# Patient Record
Sex: Male | Born: 1980 | Race: Black or African American | Hispanic: No | Marital: Single | Smoking: Current every day smoker
Health system: Southern US, Community
[De-identification: ages and names within clinical notes are randomized; demographics above are authoritative.]

## PROBLEM LIST (undated history)

## (undated) DIAGNOSIS — N529 Male erectile dysfunction, unspecified: Secondary | ICD-10-CM

## (undated) DIAGNOSIS — W3400XA Accidental discharge from unspecified firearms or gun, initial encounter: Secondary | ICD-10-CM

## (undated) DIAGNOSIS — Y249XXA Unspecified firearm discharge, undetermined intent, initial encounter: Secondary | ICD-10-CM

## (undated) HISTORY — PX: LIVER BIOPSY: SHX301

---

## 1998-05-29 ENCOUNTER — Emergency Department (HOSPITAL_COMMUNITY): Admission: EM | Admit: 1998-05-29 | Discharge: 1998-05-29 | Payer: Self-pay | Admitting: Emergency Medicine

## 1998-10-01 ENCOUNTER — Emergency Department (HOSPITAL_COMMUNITY): Admission: EM | Admit: 1998-10-01 | Discharge: 1998-10-01 | Payer: Self-pay | Admitting: Emergency Medicine

## 2000-09-04 HISTORY — PX: OTHER SURGICAL HISTORY: SHX169

## 2002-04-10 ENCOUNTER — Encounter (INDEPENDENT_AMBULATORY_CARE_PROVIDER_SITE_OTHER): Payer: Self-pay | Admitting: *Deleted

## 2002-04-10 ENCOUNTER — Inpatient Hospital Stay (HOSPITAL_COMMUNITY): Admission: AC | Admit: 2002-04-10 | Discharge: 2002-04-28 | Payer: Self-pay

## 2002-04-10 ENCOUNTER — Encounter: Payer: Self-pay | Admitting: General Surgery

## 2002-04-12 ENCOUNTER — Encounter: Payer: Self-pay | Admitting: Surgery

## 2002-04-12 ENCOUNTER — Encounter: Payer: Self-pay | Admitting: General Surgery

## 2002-04-13 ENCOUNTER — Encounter: Payer: Self-pay | Admitting: Surgery

## 2002-04-14 ENCOUNTER — Encounter: Payer: Self-pay | Admitting: General Surgery

## 2002-04-15 ENCOUNTER — Encounter: Payer: Self-pay | Admitting: General Surgery

## 2002-04-16 ENCOUNTER — Encounter: Payer: Self-pay | Admitting: General Surgery

## 2002-04-17 ENCOUNTER — Encounter: Payer: Self-pay | Admitting: General Surgery

## 2002-04-18 ENCOUNTER — Encounter: Payer: Self-pay | Admitting: General Surgery

## 2002-04-19 ENCOUNTER — Encounter: Payer: Self-pay | Admitting: General Surgery

## 2002-04-20 ENCOUNTER — Encounter: Payer: Self-pay | Admitting: Cardiothoracic Surgery

## 2002-04-20 ENCOUNTER — Encounter: Payer: Self-pay | Admitting: General Surgery

## 2002-04-21 ENCOUNTER — Encounter: Payer: Self-pay | Admitting: General Surgery

## 2002-04-23 ENCOUNTER — Encounter: Payer: Self-pay | Admitting: Cardiothoracic Surgery

## 2002-04-24 ENCOUNTER — Encounter: Payer: Self-pay | Admitting: Cardiothoracic Surgery

## 2002-04-25 ENCOUNTER — Encounter: Payer: Self-pay | Admitting: Cardiothoracic Surgery

## 2002-04-26 ENCOUNTER — Encounter: Payer: Self-pay | Admitting: Cardiothoracic Surgery

## 2002-04-27 ENCOUNTER — Encounter: Payer: Self-pay | Admitting: Thoracic Surgery (Cardiothoracic Vascular Surgery)

## 2002-04-27 ENCOUNTER — Encounter: Payer: Self-pay | Admitting: Cardiothoracic Surgery

## 2002-04-28 ENCOUNTER — Encounter: Payer: Self-pay | Admitting: Cardiothoracic Surgery

## 2006-01-03 ENCOUNTER — Encounter: Payer: Self-pay | Admitting: Emergency Medicine

## 2006-12-21 ENCOUNTER — Emergency Department (HOSPITAL_COMMUNITY): Admission: EM | Admit: 2006-12-21 | Discharge: 2006-12-21 | Payer: Self-pay | Admitting: Emergency Medicine

## 2007-03-28 ENCOUNTER — Emergency Department (HOSPITAL_COMMUNITY): Admission: EM | Admit: 2007-03-28 | Discharge: 2007-03-28 | Payer: Self-pay | Admitting: Emergency Medicine

## 2010-11-13 ENCOUNTER — Emergency Department (HOSPITAL_COMMUNITY)
Admission: EM | Admit: 2010-11-13 | Discharge: 2010-11-13 | Disposition: A | Discharge status: Home / Self Care | Payer: Self-pay | Attending: Emergency Medicine | Admitting: Emergency Medicine

## 2010-11-13 DIAGNOSIS — N489 Disorder of penis, unspecified: Secondary | ICD-10-CM | POA: Insufficient documentation

## 2010-11-13 DIAGNOSIS — N4830 Priapism, unspecified: Secondary | ICD-10-CM | POA: Insufficient documentation

## 2010-12-05 ENCOUNTER — Emergency Department (HOSPITAL_COMMUNITY)
Admission: EM | Admit: 2010-12-05 | Discharge: 2010-12-05 | Disposition: A | Discharge status: Home / Self Care | Payer: Self-pay | Attending: Emergency Medicine | Admitting: Emergency Medicine

## 2010-12-05 DIAGNOSIS — N4830 Priapism, unspecified: Secondary | ICD-10-CM | POA: Insufficient documentation

## 2010-12-17 ENCOUNTER — Emergency Department (HOSPITAL_COMMUNITY)
Admission: EM | Admit: 2010-12-17 | Discharge: 2010-12-17 | Disposition: A | Discharge status: Home / Self Care | Payer: Self-pay | Attending: Emergency Medicine | Admitting: Emergency Medicine

## 2010-12-17 DIAGNOSIS — N4830 Priapism, unspecified: Secondary | ICD-10-CM | POA: Insufficient documentation

## 2010-12-17 DIAGNOSIS — Z9889 Other specified postprocedural states: Secondary | ICD-10-CM | POA: Insufficient documentation

## 2010-12-17 DIAGNOSIS — Z87828 Personal history of other (healed) physical injury and trauma: Secondary | ICD-10-CM | POA: Insufficient documentation

## 2010-12-22 NOTE — Consult Note (Signed)
[name], [name] NO.:  [id number]  MEDICAL RECORD NO.:  [id number]           PATIENT TYPE:  E  LOCATION:  WLED                         FACILITY:  [facility]  PHYSICIAN:  Heloise Purpura, MD      DATE OF BIRTH:  1980/09/07  DATE OF CONSULTATION:  12/05/2010 DATE OF DISCHARGE:  12/05/2010                                CONSULTATION   REQUESTING PHYSICIAN:  [name] Jensen, M.D.  REASON FOR CONSULTATION:  Priapism.  HISTORY:  [name] Jensen is a 30 year old gentleman who presents to the emergency room after developing an erection which would not go down. This began at 11 a.m. this morning and presented this evening for further evaluation.  He describes moderate pain in the penis.  He denies having sickle cell disease or having a family history of sickle cell anemia or sickle cell trait.  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  He was injected with phenylephrine by Dr. Effie Jensen in theemergency department with subsequent resolution.  He presents today after also having been drinking alcohol and doing ecstasy in the past 24 hours.  He denies any fever, nausea, vomiting, or other symptoms.  PAST MEDICAL HISTORY:  None.  PAST SURGICAL HISTORY:  No GU surgery.  MEDICATIONS:  None.  ALLERGIES:  No known drug allergies.  FAMILY HISTORY:  No GU malignancy.  No history of sickle cell disease or sickle cell trait.  SOCIAL HISTORY:  He does use illicit drugs as stated above.  REVIEW OF SYSTEMS:  A complete review of systems was reviewed. Pertinent positives are included as in the history of present illness.  PHYSICAL EXAMINATION:  VITAL SIGNS:  He is afebrile with stable vital signs.  Blood pressure is 130/90. ABDOMEN:  Soft, nondistended, nontender. GU:  He has a firm, rigid erection with a rigid corpora cavernosa, but a flaccid glans penis and corpus spongiosum.  The penis is cold to  touch. He does have mild-to-moderate pain on palpation.  PROCEDURE:  The patient had been injected with 400 mcg of phenylephrine by Dr. Effie Jensen prior to my arrival to the emergency department without response over the past 90 minutes.  I had a discussion with the patient and counseled him about the risks of permanent penile damage based on the duration of his priapism as well as the potential risk for future erectile dysfunction based on the duration of his priapism up to this point.  He expresses understanding of this long-term risk simply based on the time that the priapism had been occurring.  After discussing the risks and potential complications as well as alternative options, I placed to 21-gauge butterfly needle into the left corpora cavernosa near the base of the penis.  I then irrigated the corpora with saline and aspirated blood.  Blood was easily aspirated.  I then injected him with 500 mcg of phenylephrine and 1 cc of saline.  His blood pressure was monitored and remained stable.  He had some slight improvement and required 3 more injections at the same dose which was repeated approximately every  5-8 minutes.  He also underwent further irrigation with saline and aspiration of blood from the penis.  After these injections of phenylephrine and aspiration/irrigation, his penis became semi-flaccid.  Although not totally flaccid, he no longer had a rigid erection and was felt that he would be stable for discharge home after monitoring him for 30 minutes and maintaining his semi-flaccid penis.  IMPRESSION:  Priapism.  PLAN:  [name] Jensen was strongly encouraged not to do illicit drugs and specifically ecstasy based on his episodes of recurrent priapism.  I also counseled him that he may have permanent damage to the penis and possible long-standing erectile dysfunction related to his priapism, especially considering the duration of his priapism upon presentation to the emergency  department this evening.  He expressed his understanding. He was felt to be stable for discharge home.     Heloise Purpura, MD     LB/MEDQ  D:  12/05/2010  T:  12/06/2010  Job:  [id number]  cc:   [name] Jensen, M.D. Fax: [phone]  Electronically Signed by Heloise Purpura MD on 12/22/2010 06:01:34 AM

## 2011-01-03 ENCOUNTER — Emergency Department (HOSPITAL_COMMUNITY)
Admission: EM | Admit: 2011-01-03 | Discharge: 2011-01-03 | Disposition: A | Discharge status: Home / Self Care | Payer: Self-pay | Attending: Emergency Medicine | Admitting: Emergency Medicine

## 2011-01-03 DIAGNOSIS — N4830 Priapism, unspecified: Secondary | ICD-10-CM | POA: Insufficient documentation

## 2011-01-03 DIAGNOSIS — N509 Disorder of male genital organs, unspecified: Secondary | ICD-10-CM | POA: Insufficient documentation

## 2011-01-11 NOTE — Consult Note (Signed)
NAMEARMONTE, TORTORELLA NO.:  [id number]  MEDICAL RECORD NO.:  [id number]           PATIENT TYPE:  E  LOCATION:  MCED                         FACILITY:  [facility]  PHYSICIAN:  Valetta Fuller, M.D.  DATE OF BIRTH:  15-Mar-1981  DATE OF CONSULTATION:  01/03/2011 DATE OF DISCHARGE:  01/03/2011                                CONSULTATION   REASON FOR CONSULTATION:  Priapism.  HISTORY OF PRESENT ILLNESS:  This is a 30 year old gentleman with no significant past medical history who presents to [facility] Emergency Room this morning after awakening with an erection at approximately 9 a.m.  He was unable to detumesce at his home.  He denies any pharmacological medication use such as Viagra, etc.  He denies any cocaine use, or Ecstasy use.  He does state he did use marijuana within the past 24 hours.  He denies any alcohol use.  He states that these erections have occurred 2 times prior to today within the last 1-1/2 month.  Both times he had presented to the emergency department for treatment.  He states it has occurred 2 further time before that in his early 40s or late teens without treatment required.  He has minimal complaint of discomfort at this time.  He denies any complaints of nausea, vomiting, fever, chills, diarrhea.  He denies any complaints of difficulty with urination, dysuria, urinary frequency, urinary urgency, hematuria, or nocturia.  He denies any known family history of sickle cell trait or sickle cell disease. He is unaware of his screening for sickle cell trait.  PAST MEDICAL HISTORY:  There is no known significant past medical history.  ALLERGIES:  He has no known drug allergies.  MEDICATIONS:  He takes no medications regularly.  FAMILY HISTORY:  He denies any family history of kidney cancer, bladder cancer, or prostate cancer.  SOCIAL HISTORY:  He lives here in [place].  He does state that he uses illicit drug use  consisting of marijuana and Ecstasy.  He denies any tobacco use.  He uses alcohol occasionally.  REVIEW OF SYSTEMS:  As stated above.  PHYSICAL EXAMINATION:  VITAL SIGNS:  Temperature 97.8, pulse 73, respirations 16, blood pressure 148/99. CONSTITUTIONAL:  He is a well-developed, well-nourished white male, in no acute distress. HEENT:  Normocephalic, atraumatic.  Oropharynx is clear. ABDOMEN:  Soft, nontender, nondistended. GU:  Penis with rigid, erect corpora cavernosa without lesion or mass. Bilaterally descended testes. EXTREMITIES:  Nontender, no atrophy. NEUROLOGIC:  Remote and recent memory are intact.  IMPRESSION/PLAN:  Priapism, will require injection of phenylephrine/saline mixture with probable saline irrigation to detumesce penis.  He should follow up after discharge from hospital for further evaluation consisting of screening for sickle cell trait, sickle cell disease.  He has been counseled on no further illicit drug use.  PROCEDURE:  Penis was prepped and draped in sterile fashion.  His corpora was injected 2 mL of 1% plain lidocaine.  A 21-gauge butterfly needle was placed on the right lateral side of the corpora cavernosa mid shaft.  The corpora was irrigated with copious amounts of normal saline. He was  then injected with a total of approximately 600 mcg of the phenylephrine saline solution a few at a time over 10-15 minutes.   Again, the corpora was copiously irrigated with large amount of dark blood removed.  Approximately 20 minutes later, it was 70% detumesced.  A pressure dressing was placed at the site of the butterfly needle.  He was again observed for another 15 minutes.  The corpora remained 70-90% detumescence at that time.  He again was told of he is need to follow up for further evaluation including screening for sickle cell trait.  He was also again counseled on no further illicit drug use.     Delia Chimes,  NP   ______________________________ Valetta Fuller, M.D.    MA/MEDQ  D:  01/03/2011  T:  01/04/2011  Job:  [id number]  Electronically Signed by Delia Chimes NP on 01/04/2011 01:22:56 PM Electronically Signed by Barron Alvine M.D. on 01/11/2011 08:53:03 AM

## 2011-01-12 ENCOUNTER — Emergency Department (HOSPITAL_COMMUNITY)
Admission: EM | Admit: 2011-01-12 | Discharge: 2011-01-12 | Disposition: A | Discharge status: Home / Self Care | Payer: Self-pay | Attending: Emergency Medicine | Admitting: Emergency Medicine

## 2011-01-12 DIAGNOSIS — N4830 Priapism, unspecified: Secondary | ICD-10-CM | POA: Insufficient documentation

## 2011-01-12 DIAGNOSIS — Z9889 Other specified postprocedural states: Secondary | ICD-10-CM | POA: Insufficient documentation

## 2011-01-12 DIAGNOSIS — N489 Disorder of penis, unspecified: Secondary | ICD-10-CM | POA: Insufficient documentation

## 2011-01-14 ENCOUNTER — Emergency Department (HOSPITAL_COMMUNITY)
Admission: EM | Admit: 2011-01-14 | Discharge: 2011-01-14 | Disposition: A | Discharge status: Home / Self Care | Payer: Self-pay | Attending: Emergency Medicine | Admitting: Emergency Medicine

## 2011-01-14 DIAGNOSIS — N4830 Priapism, unspecified: Secondary | ICD-10-CM | POA: Insufficient documentation

## 2011-01-18 ENCOUNTER — Emergency Department (HOSPITAL_COMMUNITY)
Admission: EM | Admit: 2011-01-18 | Discharge: 2011-01-18 | Disposition: A | Discharge status: Home / Self Care | Payer: Self-pay | Attending: Emergency Medicine | Admitting: Emergency Medicine

## 2011-01-18 DIAGNOSIS — N4830 Priapism, unspecified: Secondary | ICD-10-CM | POA: Insufficient documentation

## 2011-01-18 DIAGNOSIS — I10 Essential (primary) hypertension: Secondary | ICD-10-CM | POA: Insufficient documentation

## 2011-01-18 LAB — URINALYSIS, ROUTINE W REFLEX MICROSCOPIC
Bilirubin Urine: NEGATIVE
Glucose, UA: NEGATIVE mg/dL
Hgb urine dipstick: NEGATIVE
Ketones, ur: NEGATIVE mg/dL
Nitrite: NEGATIVE
Protein, ur: NEGATIVE mg/dL
Specific Gravity, Urine: 1.026 (ref 1.005–1.030)
Urobilinogen, UA: 1 mg/dL (ref 0.0–1.0)
pH: 6.5 (ref 5.0–8.0)

## 2011-01-18 LAB — RAPID URINE DRUG SCREEN, HOSP PERFORMED
Amphetamines: NOT DETECTED
Benzodiazepines: NOT DETECTED
Cocaine: NOT DETECTED
Opiates: NOT DETECTED
Tetrahydrocannabinol: POSITIVE — AB

## 2011-01-18 LAB — DIFFERENTIAL
Basophils Relative: 1 % (ref 0–1)
Eosinophils Absolute: 0.2 10*3/uL (ref 0.0–0.7)
Eosinophils Relative: 2 % (ref 0–5)
Lymphs Abs: 1.6 10*3/uL (ref 0.7–4.0)
Neutrophils Relative %: 72 % (ref 43–77)

## 2011-01-18 LAB — BASIC METABOLIC PANEL
BUN: 7 mg/dL (ref 6–23)
Chloride: 105 mEq/L (ref 96–112)
Creatinine, Ser: 0.82 mg/dL (ref 0.4–1.5)
GFR calc Af Amer: >60 mL/min (ref >60)
GFR calc non Af Amer: >60 mL/min (ref >60)
Potassium: 3.7 mEq/L (ref 3.5–5.1)

## 2011-01-18 LAB — CBC
MCV: 88.8 fL (ref 78.0–100.0)
Platelets: 291 10*3/uL (ref 150–400)
RBC: 4.82 MIL/uL (ref 4.22–5.81)
RDW: 13.4 % (ref 11.5–15.5)
WBC: 9.5 10*3/uL (ref 4.0–10.5)
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
## 2011-01-25 ENCOUNTER — Emergency Department (HOSPITAL_COMMUNITY)
Admission: EM | Admit: 2011-01-25 | Discharge: 2011-01-25 | Disposition: A | Discharge status: Home / Self Care | Payer: Self-pay | Attending: Emergency Medicine | Admitting: Emergency Medicine

## 2011-01-25 DIAGNOSIS — N4830 Priapism, unspecified: Secondary | ICD-10-CM | POA: Insufficient documentation

## 2011-01-27 ENCOUNTER — Emergency Department (HOSPITAL_COMMUNITY)
Admission: EM | Admit: 2011-01-27 | Discharge: 2011-01-27 | Disposition: A | Discharge status: Other Acute Inpatient Hospital | Payer: Self-pay | Attending: Emergency Medicine | Admitting: Emergency Medicine

## 2011-01-27 ENCOUNTER — Emergency Department (HOSPITAL_COMMUNITY)
Admission: EM | Admit: 2011-01-27 | Discharge: 2011-01-27 | Disposition: A | Discharge status: Home / Self Care | Payer: Self-pay | Attending: Emergency Medicine | Admitting: Emergency Medicine

## 2011-01-27 DIAGNOSIS — N4830 Priapism, unspecified: Secondary | ICD-10-CM | POA: Insufficient documentation

## 2011-02-04 ENCOUNTER — Emergency Department (HOSPITAL_COMMUNITY)
Admission: EM | Admit: 2011-02-04 | Discharge: 2011-02-04 | Disposition: A | Discharge status: Home / Self Care | Payer: Self-pay | Attending: Emergency Medicine | Admitting: Emergency Medicine

## 2011-02-04 DIAGNOSIS — N4830 Priapism, unspecified: Secondary | ICD-10-CM | POA: Insufficient documentation

## 2011-02-16 ENCOUNTER — Emergency Department (HOSPITAL_COMMUNITY)
Admission: EM | Admit: 2011-02-16 | Discharge: 2011-02-16 | Disposition: A | Discharge status: Home / Self Care | Payer: Self-pay | Attending: Emergency Medicine | Admitting: Emergency Medicine

## 2011-02-16 DIAGNOSIS — N4830 Priapism, unspecified: Secondary | ICD-10-CM | POA: Insufficient documentation

## 2011-02-21 ENCOUNTER — Emergency Department (HOSPITAL_COMMUNITY)
Admission: EM | Admit: 2011-02-21 | Discharge: 2011-02-21 | Disposition: A | Discharge status: Home / Self Care | Payer: Self-pay | Attending: Emergency Medicine | Admitting: Emergency Medicine

## 2011-02-21 DIAGNOSIS — N4830 Priapism, unspecified: Secondary | ICD-10-CM | POA: Insufficient documentation

## 2011-02-25 ENCOUNTER — Emergency Department (HOSPITAL_COMMUNITY)
Admission: EM | Admit: 2011-02-25 | Discharge: 2011-02-25 | Disposition: A | Discharge status: Home / Self Care | Payer: Self-pay | Attending: Emergency Medicine | Admitting: Emergency Medicine

## 2011-02-25 DIAGNOSIS — Z23 Encounter for immunization: Secondary | ICD-10-CM | POA: Insufficient documentation

## 2011-02-25 DIAGNOSIS — S1190XA Unspecified open wound of unspecified part of neck, initial encounter: Secondary | ICD-10-CM | POA: Insufficient documentation

## 2011-02-25 DIAGNOSIS — X58XXXA Exposure to other specified factors, initial encounter: Secondary | ICD-10-CM | POA: Insufficient documentation

## 2011-02-25 DIAGNOSIS — S21109A Unspecified open wound of unspecified front wall of thorax without penetration into thoracic cavity, initial encounter: Secondary | ICD-10-CM | POA: Insufficient documentation

## 2011-05-11 ENCOUNTER — Emergency Department (HOSPITAL_COMMUNITY)
Admission: EM | Admit: 2011-05-11 | Discharge: 2011-05-11 | Disposition: A | Discharge status: Home / Self Care | Payer: Self-pay | Attending: Emergency Medicine | Admitting: Emergency Medicine

## 2011-05-11 DIAGNOSIS — N4830 Priapism, unspecified: Secondary | ICD-10-CM | POA: Insufficient documentation

## 2011-05-11 LAB — URINALYSIS, ROUTINE W REFLEX MICROSCOPIC
Leukocytes, UA: NEGATIVE
Nitrite: NEGATIVE
Protein, ur: NEGATIVE mg/dL
pH: 7 (ref 5.0–8.0)

## 2011-05-21 ENCOUNTER — Emergency Department (HOSPITAL_COMMUNITY)
Admission: EM | Admit: 2011-05-21 | Discharge: 2011-05-22 | Disposition: A | Discharge status: Home / Self Care | Payer: No Typology Code available for payment source | Attending: Emergency Medicine | Admitting: Emergency Medicine

## 2011-05-21 DIAGNOSIS — Y92410 Unspecified street and highway as the place of occurrence of the external cause: Secondary | ICD-10-CM | POA: Insufficient documentation

## 2011-05-21 DIAGNOSIS — T1490XA Injury, unspecified, initial encounter: Secondary | ICD-10-CM | POA: Insufficient documentation

## 2011-07-11 ENCOUNTER — Emergency Department (HOSPITAL_COMMUNITY)
Admission: EM | Admit: 2011-07-11 | Discharge: 2011-07-11 | Discharge status: Against Medical Advice | Payer: Self-pay | Attending: Emergency Medicine | Admitting: Emergency Medicine

## 2011-07-11 ENCOUNTER — Encounter: Payer: Self-pay | Admitting: Emergency Medicine

## 2011-07-11 DIAGNOSIS — W19XXXA Unspecified fall, initial encounter: Secondary | ICD-10-CM | POA: Insufficient documentation

## 2011-07-11 DIAGNOSIS — S298XXA Other specified injuries of thorax, initial encounter: Secondary | ICD-10-CM | POA: Insufficient documentation
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
## 2012-02-16 ENCOUNTER — Emergency Department (HOSPITAL_COMMUNITY)
Admission: EM | Admit: 2012-02-16 | Discharge: 2012-02-16 | Disposition: A | Discharge status: Home / Self Care | Payer: Self-pay | Attending: Emergency Medicine | Admitting: Emergency Medicine

## 2012-02-16 ENCOUNTER — Emergency Department (HOSPITAL_COMMUNITY): Payer: Self-pay

## 2012-02-16 ENCOUNTER — Encounter (HOSPITAL_COMMUNITY): Payer: Self-pay

## 2012-02-16 DIAGNOSIS — IMO0002 Reserved for concepts with insufficient information to code with codable children: Secondary | ICD-10-CM | POA: Insufficient documentation

## 2012-02-16 DIAGNOSIS — S62609A Fracture of unspecified phalanx of unspecified finger, initial encounter for closed fracture: Secondary | ICD-10-CM

## 2012-02-16 DIAGNOSIS — W219XXA Striking against or struck by unspecified sports equipment, initial encounter: Secondary | ICD-10-CM | POA: Insufficient documentation

## 2012-02-16 HISTORY — DX: Accidental discharge from unspecified firearms or gun, initial encounter: W34.00XA

## 2012-02-16 HISTORY — DX: Unspecified firearm discharge, undetermined intent, initial encounter: Y24.9XXA

## 2012-02-16 MED ORDER — IBUPROFEN 800 MG PO TABS: 800.0000 mg | ORAL_TABLET | Freq: Three times a day (TID) | ORAL | Status: AC

## 2012-02-16 MED ORDER — IBUPROFEN 800 MG PO TABS
800.0000 mg | ORAL_TABLET | Freq: Once | ORAL | Status: AC
  Administered 2012-02-16: 800 mg via ORAL
  Filled 2012-02-16: qty 1

## 2012-02-16 NOTE — ED Notes (Signed)
Patient states that he was punching a punching bag and hurt his left ring finger

## 2012-02-16 NOTE — ED Provider Notes (Signed)
Medical screening examination/treatment/procedure(s) were performed by non-physician practitioner and as supervising physician I was immediately available for consultation/collaboration.   Tashena Ibach A Roylee Chaffin, MD 02/16/12 2357 
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
## 2012-02-16 NOTE — Discharge Instructions (Signed)
Be sure to read and understand instructions below prior to leaving the hospital. Follow up with the orthopedic listed above for your injury.   Buddy Taping  You have a minor finger or toe injury. It can be managed by buddy taping. Buddy taping means the injured finger or toe is taped to a healthy uninjured adjacent finger or toe. Most minor fractures and dislocations of the smaller fingers and toes will heal in 3 to 4 weeks. Buddy taping immobilizes and protects the area of injury. Buddy taping is not recommended for initial treatment of fractures of the thumb, longer fingers, or the great toe. Buddy taping should not be used for unstable or deformed fractures, but as fracture healing progresses it may be used for protection during rehabilitation. Fractured fingers and toes should be protected by buddy taping as long as the injury is still painful or swollen.  When an injury is buddy taped, place a small piece of gauze or cotton between the digits that are taped. This helps prevent the skin from breaking down from increased moisture. Buddy taping allows you to get your injury wet when you bathe. Change the gauze and tape more often if it gets wet, and dry the space between the finger or toes. Use a sturdy, hard-soled shoe for better support if you have a fractured toe. In 2 to 3 weeks you can start motion exercises. This will keep the fingers or toes from becoming stiff.  SEEK IMMEDIATE MEDICAL CARE IF:  The injured area becomes cold, numb, or pale.  You have pain not controlled with medications.  You notice increasing deformity of the toe or finger.   TREATMENT  Rest, ice, elevation, and compression are the basic modes of treatment.    HOME CARE INSTRUCTIONS  Apply ice to the sore area for 15 to 20 minutes, 3 to 4 times per day. Do this while you are awake for the first 2 days, or as directed. This can be stopped when the swelling goes away. Put the ice in a plastic bag and place a towel between the  bag of ice and your skin.  Keep your arm elevated above your heart when possible to lessen swelling.  ACTIVITY- Exercises should be limited to pain free range of motion  SEEK IMMEDIATE MEDICAL CARE IF:  Your fingers are swollen, very red, white, or cold and blue.  Your fingers are numb or tingling.  You have increasing pain.  You have difficulty moving your fingers.   HOW TO MAKE AN ICE PACK  To make an ice pack, do one of the following:  Place crushed ice or a bag of frozen vegetables in a sealable plastic bag. Squeeze out the excess air. Place this bag inside another plastic bag. Slide the bag into a pillowcase or place a damp towel between your skin and the bag.  Mix 3 parts water with 1 part rubbing alcohol. Freeze the mixture in a sealable plastic bag. When you remove the mixture from the freezer, it will be slushy. Squeeze out the excess air. Place this bag inside another plastic bag. Slide the bag into a pillowcase or place a damp towel between your skin  RESOURCE GUIDE  Chronic Pain Problems: Contact [facility]  [phone] Patients need to be referred by their primary care doctor.  Insufficient Money for Medicine: Contact United Way:  call "211" or [facility] 215-830-9571.  No Primary Care Doctor: - Call [facility] Connect  [phone] - can help  you locate a primary care doctor that  accepts your insurance, provides certain services, etc. - Physician Referral Service- [phone]  Agencies that provide inexpensive medical care: - [facility]  [phone] - [facility]  [phone] - [facility]  [phone] - Women's Clinic  [phone] - Planned Parenthood  [phone] [facility]  [phone]  Medicaid-accepting [facility] Providers: - [facility]- [address]  [phone], Mon-Fri 9am-7pm, Sat 9am-1pm - [facility]- [address] [place], Suite  [place]  [phone] - [facility]- [address], [address] [place]  [phone] [facility]- [address]  [phone] - Renaye Rakers- [address] [place], [address], [phone]  Only accepts [place] patients after they have their name  applied to their card  Self Pay (no insurance) in [place]: - Sickle Cell Patients: Dr Willey Blade, [facility]  [address] [place], [phone] - [facility]- [address] [place]  [phone]       [facility] [place]- [address]       -     [facility]- see information above (Speak to [employer] if you do not have insurance)       -  [facility]- [address] [place], [phone]       -  [facility]- [address],  [phone]       -  [facility]- [address], [phone]       -  Dr Julio Sicks-  [address], [place], [phone]       -  [facility]- [address] [phone]       -  [facility]- [address], [phone], also [address], [phone]       -    [facility] [place] [facility]- [address] [place], [phone], 1st & 3rd Saturday   every month, 10am-1pm  1) Find a Doctor and Pay Out of Pocket Although you won't have to find out who is covered by your insurance plan, it is a good idea to ask around and get recommendations. You will then need to call the office and see if the doctor you have chosen will accept you as a new patient and what types of options they offer for patients who are self-pay. Some doctors offer discounts or will set up payment plans for their patients who do not have insurance, but you will need to ask so you aren't surprised when you get to your appointment.  2) Contact Your Local Health Department Not all health departments have doctors that can see patients for sick visits, but many do, so it is worth a call to see if yours does.  If you don't know where your local health department is, you can check in your phone book. The CDC also has a tool to help you locate your state's health department, and many state websites also have listings of all of their local health departments.  3) Find a Walk-in Clinic If your illness is not likely to be very severe or complicated, you may want to try a walk in clinic. These are popping up all over the country in pharmacies, drugstores, and shopping centers. They're usually staffed by  nurse practitioners or physician assistants that have been trained to treat common illnesses and complaints. They're usually fairly quick and inexpensive. However, if you have serious medical issues or chronic medical problems, these are probably not your best option  STD Testing - [facility] Department of [facility], STD Clinic, [address] [place], phone [phone] or [phone].  Monday - Friday, call for an appointment. [facility] Department of [employer], STD Clinic, [address], [place], phone [phone] or [phone].  Monday - Friday, call for an appointment.  Abuse/Neglect: [facility] Child Abuse Hotline [phone] [facility] Child Abuse Hotline (561) 523-4817 (After Hours)  Emergency Shelter:  [facility] (984)517-5893  Maternity Homes: - [facility] 416-501-2208 - [facility] [phone]  MRSA Hotline #:   (818)013-3060  [facility] [place] Resources  [facility] [place]  United Way [facility] Dept. [address]                 [address]         [address] [place] [address]  Blondell Reveal Phone:  [phone]                                  Phone:  [phone]                   Phone:  [phone]  [facility], [phone] - [phone]       -     [facility] in [place], [address]                                  [phone], [facility] Child Abuse Hotline 806-794-2490 or [phone] (After Hours)   Behavioral Health Services  Substance Abuse Resources: - [facility] Drug [facility]  571-722-1444 - [facility] [phone] - [facility] [place] [phone] Floydene Flock [phone] - Residential & Outpatient Substance Abuse Program  2626203256  Psychological Services: [facility]  [phone] Services  [phone] - [facility], [phone] [place]. [address], [place], ACCESS LINE: 831-335-1660 or [phone], [employer]  Dental Assistance  If unable to pay or uninsured, contact:  [facility] or [facility] to become qualified for the adult dental clinic.  Patients with Medicaid: [facility] [phone] [address], [phone] [address] [phone]  If unable to pay, or uninsured, contact [facility] [phone]) or [facility] Department [phone] in [place], [phone] in [facility]) to become qualified for the adult dental clinic  Other Low-Cost Community Dental Services: - [facility]- [address] [place], [place], [place], [address], [phone], [address]. [address], 2nd and 4th Thursday of the month  at 6:30am.  10 clients each day by appointment, can sometimes see walk-in patients if someone does not show for an appointment. [facility]- [address] [place], [place], [address], [phone] - [facility]- [address], [place], [place], [address], [phone] - [facility]- [phone] [facility] Health Department- [phone] [facility] Department- [phone]

## 2013-07-30 ENCOUNTER — Encounter (HOSPITAL_COMMUNITY): Payer: Self-pay | Admitting: Emergency Medicine

## 2013-07-30 ENCOUNTER — Emergency Department (HOSPITAL_COMMUNITY)
Admission: EM | Admit: 2013-07-30 | Discharge: 2013-07-30 | Disposition: A | Discharge status: Home / Self Care | Payer: Self-pay | Attending: Emergency Medicine | Admitting: Emergency Medicine

## 2013-07-30 ENCOUNTER — Emergency Department (HOSPITAL_COMMUNITY): Payer: Self-pay

## 2013-07-30 DIAGNOSIS — F17200 Nicotine dependence, unspecified, uncomplicated: Secondary | ICD-10-CM | POA: Insufficient documentation

## 2013-07-30 DIAGNOSIS — R1084 Generalized abdominal pain: Secondary | ICD-10-CM | POA: Insufficient documentation

## 2013-07-30 DIAGNOSIS — R109 Unspecified abdominal pain: Secondary | ICD-10-CM

## 2013-07-30 DIAGNOSIS — Z87828 Personal history of other (healed) physical injury and trauma: Secondary | ICD-10-CM | POA: Insufficient documentation

## 2013-07-30 DIAGNOSIS — R197 Diarrhea, unspecified: Secondary | ICD-10-CM | POA: Insufficient documentation

## 2013-07-30 DIAGNOSIS — R112 Nausea with vomiting, unspecified: Secondary | ICD-10-CM | POA: Insufficient documentation

## 2013-07-30 LAB — CBC WITH DIFFERENTIAL/PLATELET
Basophils Absolute: 0.1 10*3/uL (ref 0.0–0.1)
Basophils Relative: 1 % (ref 0–1)
Lymphocytes Relative: 33 % (ref 12–46)
MCHC: 34.6 g/dL (ref 30.0–36.0)
Neutro Abs: 3.4 10*3/uL (ref 1.7–7.7)
Neutrophils Relative %: 52 % (ref 43–77)
RDW: 14.4 % (ref 11.5–15.5)
WBC: 6.6 10*3/uL (ref 4.0–10.5)

## 2013-07-30 LAB — URINALYSIS, ROUTINE W REFLEX MICROSCOPIC
Glucose, UA: NEGATIVE mg/dL
Hgb urine dipstick: NEGATIVE
Ketones, ur: NEGATIVE mg/dL
Leukocytes, UA: NEGATIVE
pH: 7 (ref 5.0–8.0)

## 2013-07-30 LAB — COMPREHENSIVE METABOLIC PANEL
ALT: 20 U/L (ref 0–53)
AST: 21 U/L (ref 0–37)
Albumin: 4.2 g/dL (ref 3.5–5.2)
Alkaline Phosphatase: 65 U/L (ref 39–117)
Chloride: 104 mEq/L (ref 96–112)
Creatinine, Ser: 0.94 mg/dL (ref 0.50–1.35)
Potassium: 3.6 mEq/L (ref 3.5–5.1)
Sodium: 140 mEq/L (ref 135–145)
Total Bilirubin: 0.8 mg/dL (ref 0.3–1.2)

## 2013-07-30 MED ORDER — OXYCODONE HCL 5 MG PO TABS
5.0000 mg | ORAL_TABLET | Freq: Once | ORAL | Status: AC
  Administered 2013-07-30: 5 mg via ORAL
  Filled 2013-07-30: qty 1

## 2013-07-30 MED ORDER — PROMETHAZINE HCL 25 MG PO TABS: 25.0000 mg | ORAL_TABLET | Freq: Four times a day (QID) | ORAL | Status: DC | PRN
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
## 2013-07-30 NOTE — ED Notes (Signed)
Pt given 2 [place] sandwiches, eating and drinking at discharge. Denies pain at present.

## 2013-07-30 NOTE — ED Notes (Signed)
Pt c/o generalized abd pain with some nausea x 2 days

## 2013-10-24 ENCOUNTER — Emergency Department (HOSPITAL_COMMUNITY)
Admission: EM | Admit: 2013-10-24 | Discharge: 2013-10-24 | Disposition: A | Discharge status: Home / Self Care | Payer: Self-pay | Attending: Emergency Medicine | Admitting: Emergency Medicine

## 2013-10-24 ENCOUNTER — Encounter (HOSPITAL_COMMUNITY): Payer: Self-pay | Admitting: Emergency Medicine

## 2013-10-24 DIAGNOSIS — Z87828 Personal history of other (healed) physical injury and trauma: Secondary | ICD-10-CM | POA: Insufficient documentation

## 2013-10-24 DIAGNOSIS — F17200 Nicotine dependence, unspecified, uncomplicated: Secondary | ICD-10-CM | POA: Insufficient documentation

## 2013-10-24 DIAGNOSIS — J111 Influenza due to unidentified influenza virus with other respiratory manifestations: Secondary | ICD-10-CM | POA: Insufficient documentation

## 2013-10-24 LAB — CBC WITH DIFFERENTIAL/PLATELET
BASOS ABS: 0.1 10*3/uL (ref 0.0–0.1)
BASOS PCT: 1 % (ref 0–1)
EOS ABS: 0.5 10*3/uL (ref 0.0–0.7)
EOS PCT: 6 % — AB (ref 0–5)
HEMATOCRIT: 44 % (ref 39.0–52.0)
Hemoglobin: 15 g/dL (ref 13.0–17.0)
LYMPHS PCT: 21 % (ref 12–46)
Lymphs Abs: 1.9 10*3/uL (ref 0.7–4.0)
MCH: 30.5 pg (ref 26.0–34.0)
MCHC: 34.1 g/dL (ref 30.0–36.0)
MCV: 89.4 fL (ref 78.0–100.0)
MONO ABS: 0.8 10*3/uL (ref 0.1–1.0)
Monocytes Relative: 9 % (ref 3–12)
Neutro Abs: 5.9 10*3/uL (ref 1.7–7.7)
Neutrophils Relative %: 64 % (ref 43–77)
PLATELETS: 378 10*3/uL (ref 150–400)
RBC: 4.92 MIL/uL (ref 4.22–5.81)
RDW: 12.8 % (ref 11.5–15.5)
WBC: 9.2 10*3/uL (ref 4.0–10.5)

## 2013-10-24 LAB — COMPREHENSIVE METABOLIC PANEL
ALBUMIN: 4 g/dL (ref 3.5–5.2)
ALT: 17 U/L (ref 0–53)
AST: 19 U/L (ref 0–37)
Alkaline Phosphatase: 66 U/L (ref 39–117)
BUN: 9 mg/dL (ref 6–23)
CALCIUM: 9.4 mg/dL (ref 8.4–10.5)
CO2: 25 mEq/L (ref 19–32)
CREATININE: 0.88 mg/dL (ref 0.50–1.35)
Chloride: 102 mEq/L (ref 96–112)
GFR calc Af Amer: >90 mL/min (ref >90)
Glucose, Bld: 103 mg/dL — ABNORMAL HIGH (ref 70–99)
Potassium: 4.1 mEq/L (ref 3.7–5.3)
SODIUM: 140 meq/L (ref 137–147)
TOTAL PROTEIN: 7.5 g/dL (ref 6.0–8.3)
Total Bilirubin: 0.6 mg/dL (ref 0.3–1.2)

## 2013-10-24 LAB — URINALYSIS, ROUTINE W REFLEX MICROSCOPIC
Bilirubin Urine: NEGATIVE
GLUCOSE, UA: NEGATIVE mg/dL
HGB URINE DIPSTICK: NEGATIVE
KETONES UR: NEGATIVE mg/dL
Leukocytes, UA: NEGATIVE
Nitrite: NEGATIVE
PROTEIN: NEGATIVE mg/dL
Specific Gravity, Urine: 1.022 (ref 1.005–1.030)
UROBILINOGEN UA: 4 mg/dL — AB (ref 0.0–1.0)
pH: 6.5 (ref 5.0–8.0)

## 2013-10-24 NOTE — ED Notes (Signed)
The pt is c/o havfing a cold with a headache cough sorethroat abd pain nose running diarrhea.  Feeling  Very cold all for 3-4 days

## 2013-10-24 NOTE — Discharge Instructions (Signed)
Antibiotic Nonuse ° Your caregiver felt that the infection or problem was not one that would be helped with an antibiotic. °Infections may be caused by viruses or bacteria. Only a caregiver can tell which one of these is the likely cause of an illness. A cold is the most common cause of infection in both adults and children. A cold is a virus. Antibiotic treatment will have no effect on a viral infection. Viruses can lead to many lost days of work caring for sick children and many missed days of school. Children may catch as many as 10 "colds" or "flus" per year during which they can be tearful, cranky, and uncomfortable. The goal of treating a virus is aimed at keeping the ill person comfortable. °Antibiotics are medications used to help the body fight bacterial infections. There are relatively few types of bacteria that cause infections but there are hundreds of viruses. While both viruses and bacteria cause infection they are very different types of germs. A viral infection will typically go away by itself within 7 to 10 days. Bacterial infections may spread or get worse without antibiotic treatment. °Examples of bacterial infections are: °· Sore throats (like strep throat or tonsillitis). °· Infection in the lung (pneumonia). °· Ear and skin infections. °Examples of viral infections are: °· Colds or flus. °· Most coughs and bronchitis. °· Sore throats not caused by Strep. °· Runny noses. °It is often best not to take an antibiotic when a viral infection is the cause of the problem. Antibiotics can kill off the helpful bacteria that we have inside our body and allow harmful bacteria to start growing. Antibiotics can cause side effects such as allergies, nausea, and diarrhea without helping to improve the symptoms of the viral infection. Additionally, repeated uses of antibiotics can cause bacteria inside of our body to become resistant. That resistance can be passed onto harmful bacterial. The next time you have  an infection it may be harder to treat if antibiotics are used when they are not needed. Not treating with antibiotics allows our own immune system to develop and take care of infections more efficiently. Also, antibiotics will work better for us when they are prescribed for bacterial infections. °Treatments for a child that is ill may include: °· Give extra fluids throughout the day to stay hydrated. °· Get plenty of rest. °· Only give your child over-the-counter or prescription medicines for pain, discomfort, or fever as directed by your caregiver. °· The use of a cool mist humidifier may help stuffy noses. °· Cold medications if suggested by your caregiver. °Your caregiver may decide to start you on an antibiotic if: °· The problem you were seen for today continues for a longer length of time than expected. °· You develop a secondary bacterial infection. °SEEK MEDICAL CARE IF: °· Fever lasts longer than 5 days. °· Symptoms continue to get worse after 5 to 7 days or become severe. °· Difficulty in breathing develops. °· Signs of dehydration develop (poor drinking, rare urinating, dark colored urine). °· Changes in behavior or worsening tiredness (listlessness or lethargy). °Document Released: 10/30/2001 Document Revised: 11/13/2011 Document Reviewed: 04/28/2009 °ExitCare® Patient Information ©2014 [employer]. °You appear to have an upper respiratory infection (URI). An upper respiratory tract infection, or cold, is a viral infection of the air passages leading to the lungs. It is contagious and can be spread to others, especially during the first 3 or 4 days. It cannot be cured by antibiotics or other medicines. °RETURN IMMEDIATELY   IF you develop shortness of breath, confusion or altered mental status, a new rash, become dizzy, faint, or poorly responsive, or are unable to be cared for at home. °
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
## 2013-12-15 ENCOUNTER — Encounter (HOSPITAL_COMMUNITY): Payer: Self-pay | Admitting: Emergency Medicine

## 2013-12-15 ENCOUNTER — Emergency Department (HOSPITAL_COMMUNITY)
Admission: EM | Admit: 2013-12-15 | Discharge: 2013-12-16 | Disposition: A | Discharge status: Home / Self Care | Payer: Self-pay | Attending: Emergency Medicine | Admitting: Emergency Medicine

## 2013-12-15 ENCOUNTER — Emergency Department (HOSPITAL_COMMUNITY): Payer: Self-pay

## 2013-12-15 DIAGNOSIS — R630 Anorexia: Secondary | ICD-10-CM | POA: Insufficient documentation

## 2013-12-15 DIAGNOSIS — Z87828 Personal history of other (healed) physical injury and trauma: Secondary | ICD-10-CM | POA: Insufficient documentation

## 2013-12-15 DIAGNOSIS — R42 Dizziness and giddiness: Secondary | ICD-10-CM | POA: Insufficient documentation

## 2013-12-15 DIAGNOSIS — R109 Unspecified abdominal pain: Secondary | ICD-10-CM | POA: Insufficient documentation

## 2013-12-15 DIAGNOSIS — R05 Cough: Secondary | ICD-10-CM

## 2013-12-15 DIAGNOSIS — R059 Cough, unspecified: Secondary | ICD-10-CM

## 2013-12-15 DIAGNOSIS — J159 Unspecified bacterial pneumonia: Secondary | ICD-10-CM | POA: Insufficient documentation

## 2013-12-15 DIAGNOSIS — F17200 Nicotine dependence, unspecified, uncomplicated: Secondary | ICD-10-CM | POA: Insufficient documentation

## 2013-12-15 DIAGNOSIS — R51 Headache: Secondary | ICD-10-CM | POA: Insufficient documentation

## 2013-12-15 DIAGNOSIS — J189 Pneumonia, unspecified organism: Secondary | ICD-10-CM

## 2013-12-15 MED ORDER — LEVOFLOXACIN 500 MG PO TABS
750.0000 mg | ORAL_TABLET | Freq: Once | ORAL | Status: AC
  Administered 2013-12-16: 750 mg via ORAL
  Filled 2013-12-15: qty 2

## 2013-12-15 NOTE — ED Notes (Signed)
Patient states that he is having a cough and congestion for the last 2 -3 days
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
## 2013-12-16 MED ORDER — ALBUTEROL SULFATE HFA 108 (90 BASE) MCG/ACT IN AERS
2.0000 | INHALATION_SPRAY | Freq: Once | RESPIRATORY_TRACT | Status: AC
  Administered 2013-12-16: 2 via RESPIRATORY_TRACT
  Filled 2013-12-16: qty 6.7

## 2013-12-16 MED ORDER — HYDROCODONE-HOMATROPINE 5-1.5 MG/5ML PO SYRP: 5.0000 mL | ORAL_SOLUTION | Freq: Four times a day (QID) | ORAL | Status: DC | PRN

## 2013-12-16 MED ORDER — LEVOFLOXACIN 250 MG PO TABS: 750.0000 mg | ORAL_TABLET | Freq: Every day | ORAL | Status: DC

## 2013-12-16 NOTE — Discharge Instructions (Signed)
Take Levaquin to cover for pneumonia. Use an albuterol inhaler, 2 puffs every 4 hours as needed for shortness of breath. Take Tylenol for fever and chest discomfort. Take Hycodan for cough. Get plenty of rest and drink plenty of fluids. Return if symptoms worsen.  Pneumonia, Adult Pneumonia is an infection of the lungs.  CAUSES Pneumonia may be caused by bacteria or a virus. Usually, these infections are caused by breathing infectious particles into the lungs (respiratory tract). SYMPTOMS   Cough.  Fever.  Chest pain.  Increased rate of breathing.  Wheezing.  Mucus production. DIAGNOSIS  If you have the common symptoms of pneumonia, your caregiver will typically confirm the diagnosis with a chest X-ray. The X-ray will show an abnormality in the lung (pulmonary infiltrate) if you have pneumonia. Other tests of your blood, urine, or sputum may be done to find the specific cause of your pneumonia. Your caregiver may also do tests (blood gases or pulse oximetry) to see how well your lungs are working. TREATMENT  Some forms of pneumonia may be spread to other people when you cough or sneeze. You may be asked to wear a mask before and during your exam. Pneumonia that is caused by bacteria is treated with antibiotic medicine. Pneumonia that is caused by the influenza virus may be treated with an antiviral medicine. Most other viral infections must run their course. These infections will not respond to antibiotics.  PREVENTION A pneumococcal shot (vaccine) is available to prevent a common bacterial cause of pneumonia. This is usually suggested for:  People over 35 years old.  Patients on chemotherapy.  People with chronic lung problems, such as bronchitis or emphysema.  People with immune system problems. If you are over 65 or have a high risk condition, you may receive the pneumococcal vaccine if you have not received it before. In some countries, a routine influenza vaccine is also  recommended. This vaccine can help prevent some cases of pneumonia.You may be offered the influenza vaccine as part of your care. If you smoke, it is time to quit. You may receive instructions on how to stop smoking. Your caregiver can provide medicines and counseling to help you quit. HOME CARE INSTRUCTIONS   Cough suppressants may be used if you are losing too much rest. However, coughing protects you by clearing your lungs. You should avoid using cough suppressants if you can.  Your caregiver may have prescribed medicine if he or she thinks your pneumonia is caused by a bacteria or influenza. Finish your medicine even if you start to feel better.  Your caregiver may also prescribe an expectorant. This loosens the mucus to be coughed up.  Only take over-the-counter or prescription medicines for pain, discomfort, or fever as directed by your caregiver.  Do not smoke. Smoking is a common cause of bronchitis and can contribute to pneumonia. If you are a smoker and continue to smoke, your cough may last several weeks after your pneumonia has cleared.  A cold steam vaporizer or humidifier in your room or home may help loosen mucus.  Coughing is often worse at night. Sleeping in a semi-upright position in a recliner or using a couple pillows under your head will help with this.  Get rest as you feel it is needed. Your body will usually let you know when you need to rest. SEEK IMMEDIATE MEDICAL CARE IF:   Your illness becomes worse. This is especially true if you are elderly or weakened from any other disease.  You  cannot control your cough with suppressants and are losing sleep.  You begin coughing up blood.  You develop pain which is getting worse or is uncontrolled with medicines.  You have a fever.  Any of the symptoms which initially brought you in for treatment are getting worse rather than better.  You develop shortness of breath or chest pain. MAKE SURE YOU:   Understand these  instructions.  Will watch your condition.  Will get help right away if you are not doing well or get worse. Document Released: 08/21/2005 Document Revised: 11/13/2011 Document Reviewed: 11/10/2010 [facility] Patient Information 2014 [place].

## 2013-12-22 NOTE — ED Provider Notes (Signed)
Medical screening examination/treatment/procedure(s) were performed by non-physician practitioner and as supervising physician I was immediately available for consultation/collaboration.   EKG Interpretation None        Shon Batonourtney F Horton, MD 12/22/13 1711

## 2014-02-08 ENCOUNTER — Emergency Department (HOSPITAL_COMMUNITY)
Admission: EM | Admit: 2014-02-08 | Discharge: 2014-02-09 | Disposition: A | Discharge status: Home / Self Care | Payer: Self-pay | Attending: Emergency Medicine | Admitting: Emergency Medicine

## 2014-02-08 ENCOUNTER — Encounter (HOSPITAL_COMMUNITY): Payer: Self-pay | Admitting: Emergency Medicine

## 2014-02-08 DIAGNOSIS — L03317 Cellulitis of buttock: Principal | ICD-10-CM

## 2014-02-08 DIAGNOSIS — F17200 Nicotine dependence, unspecified, uncomplicated: Secondary | ICD-10-CM | POA: Insufficient documentation

## 2014-02-08 DIAGNOSIS — L0231 Cutaneous abscess of buttock: Secondary | ICD-10-CM | POA: Insufficient documentation

## 2014-02-08 DIAGNOSIS — Z79899 Other long term (current) drug therapy: Secondary | ICD-10-CM | POA: Insufficient documentation

## 2014-02-08 DIAGNOSIS — Z87828 Personal history of other (healed) physical injury and trauma: Secondary | ICD-10-CM | POA: Insufficient documentation

## 2014-02-08 NOTE — ED Notes (Signed)
Per pt report: pt has a recurrent abscess on pt's left butt cheek. Pt poped the abscess but pt reports it keeps coming back. Pt has concerns for infection.

## 2014-02-09 MED ORDER — SULFAMETHOXAZOLE-TRIMETHOPRIM 800-160 MG PO TABS: 1.0000 | ORAL_TABLET | Freq: Two times a day (BID) | ORAL | Status: DC

## 2014-02-09 MED ORDER — CEPHALEXIN 500 MG PO CAPS: 500.0000 mg | ORAL_CAPSULE | Freq: Four times a day (QID) | ORAL | Status: DC

## 2014-02-09 MED ORDER — SULFAMETHOXAZOLE-TRIMETHOPRIM 800-160 MG PO TABS: 1.0000 | ORAL_TABLET | Freq: Two times a day (BID) | ORAL | Status: AC

## 2014-02-09 NOTE — Discharge Instructions (Signed)
Take Bactrim and Keflex as prescribed. Follow up with general surgery to discuss removal of the core of your cyst.  Abscess Care After An abscess (also called a boil or furuncle) is an infected area that contains a collection of pus. Signs and symptoms of an abscess include pain, tenderness, redness, or hardness, or you may feel a moveable soft area under your skin. An abscess can occur anywhere in the body. The infection may spread to surrounding tissues causing cellulitis. A cut (incision) by the surgeon was made over your abscess and the pus was drained out. Gauze may have been packed into the space to provide a drain that will allow the cavity to heal from the inside outwards. The boil may be painful for 5 to 7 days. Most people with a boil do not have high fevers. Your abscess, if seen early, may not have localized, and may not have been lanced. If not, another appointment may be required for this if it does not get better on its own or with medications. HOME CARE INSTRUCTIONS   Only take over-the-counter or prescription medicines for pain, discomfort, or fever as directed by your caregiver.  When you bathe, soak and then remove gauze or iodoform packs at least daily or as directed by your caregiver. You may then wash the wound gently with mild soapy water. Repack with gauze or do as your caregiver directs. SEEK IMMEDIATE MEDICAL CARE IF:   You develop increased pain, swelling, redness, drainage, or bleeding in the wound site.  You develop signs of generalized infection including muscle aches, chills, fever, or a general ill feeling.  An oral temperature above 102 F (38.9 C) develops, not controlled by medication. See your caregiver for a recheck if you develop any of the symptoms described above. If medications (antibiotics) were prescribed, take them as directed. Document Released: 03/09/2005 Document Revised: 11/13/2011 Document Reviewed: 11/04/2007 [facility] Patient Information 2014  [place], [place].
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
## 2014-02-15 NOTE — ED Provider Notes (Signed)
Medical screening examination/treatment/procedure(s) were performed by non-physician practitioner and as supervising physician I was immediately available for consultation/collaboration.   EKG Interpretation None        Brandt LoosenJulie Jaylah Goodlow, MD 02/15/14 [phone]

## 2014-03-15 ENCOUNTER — Emergency Department (HOSPITAL_COMMUNITY)
Admission: EM | Admit: 2014-03-15 | Discharge: 2014-03-15 | Disposition: A | Discharge status: Home / Self Care | Payer: Self-pay | Attending: Emergency Medicine | Admitting: Emergency Medicine

## 2014-03-15 ENCOUNTER — Encounter (HOSPITAL_COMMUNITY): Payer: Self-pay | Admitting: Emergency Medicine

## 2014-03-15 DIAGNOSIS — R300 Dysuria: Secondary | ICD-10-CM

## 2014-03-15 DIAGNOSIS — F17200 Nicotine dependence, unspecified, uncomplicated: Secondary | ICD-10-CM | POA: Insufficient documentation

## 2014-03-15 DIAGNOSIS — Z87828 Personal history of other (healed) physical injury and trauma: Secondary | ICD-10-CM | POA: Insufficient documentation

## 2014-03-15 DIAGNOSIS — R369 Urethral discharge, unspecified: Secondary | ICD-10-CM | POA: Insufficient documentation

## 2014-03-15 LAB — URINALYSIS, ROUTINE W REFLEX MICROSCOPIC
Bilirubin Urine: NEGATIVE
Glucose, UA: NEGATIVE mg/dL
HGB URINE DIPSTICK: NEGATIVE
KETONES UR: 15 mg/dL — AB
Nitrite: NEGATIVE
PROTEIN: NEGATIVE mg/dL
Specific Gravity, Urine: 1.024 (ref 1.005–1.030)
Urobilinogen, UA: 1 mg/dL (ref 0.0–1.0)
pH: 6 (ref 5.0–8.0)

## 2014-03-15 LAB — URINE MICROSCOPIC-ADD ON

## 2014-03-15 MED ORDER — AZITHROMYCIN 250 MG PO TABS
2000.0000 mg | ORAL_TABLET | Freq: Once | ORAL | Status: AC
  Administered 2014-03-15: 2000 mg via ORAL
  Filled 2014-03-15: qty 8

## 2014-03-15 MED ORDER — LIDOCAINE HCL (PF) 1 % IJ SOLN
0.9000 mL | Freq: Once | INTRAMUSCULAR | Status: AC
  Administered 2014-03-15: 0.9 mL
  Filled 2014-03-15: qty 5

## 2014-03-15 MED ORDER — CEFTRIAXONE SODIUM 250 MG IJ SOLR
250.0000 mg | Freq: Once | INTRAMUSCULAR | Status: AC
  Administered 2014-03-15: 250 mg via INTRAMUSCULAR
  Filled 2014-03-15: qty 250
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
## 2014-03-15 NOTE — ED Notes (Signed)
Informed pt that he has to stay for 30 minutes to ensure that he does not have a reaction to the injection. Stated that he wont have a reaction. Explained that this is our policy and this nurse will bring in his dc papers when they are ready

## 2014-03-15 NOTE — ED Notes (Signed)
The pt is c/o painful urination since Thursday.  He thinks he has  A std
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
## 2014-03-15 NOTE — Discharge Instructions (Signed)
Sexually Transmitted Disease °A sexually transmitted disease (STD) is a disease or infection that may be passed (transmitted) from person to person, usually during sexual activity. This may happen by way of saliva, semen, blood, vaginal mucus, or urine. Common STDs include:  °· Gonorrhea.   °· Chlamydia.   °· Syphilis.   °· HIV and AIDS.   °· Genital herpes.   °· Hepatitis B and C.   °· Trichomonas.   °· Human papillomavirus (HPV).   °· Pubic lice.   °· Scabies. °· Mites. °· Bacterial vaginosis. °WHAT ARE CAUSES OF STDs? °An STD may be caused by bacteria, a virus, or parasites. STDs are often transmitted during sexual activity if one person is infected. However, they may also be transmitted through nonsexual means. STDs may be transmitted after:  °· Sexual intercourse with an infected person.   °· Sharing sex toys with an infected person.   °· Sharing needles with an infected person or using unclean piercing or tattoo needles. °· Having intimate contact with the genitals, mouth, or rectal areas of an infected person.   °· Exposure to infected fluids during birth. °WHAT ARE THE SIGNS AND SYMPTOMS OF STDs? °Different STDs have different symptoms. Some people may not have any symptoms. If symptoms are present, they may include:  °· Painful or bloody urination.   °· Pain in the pelvis, abdomen, vagina, anus, throat, or eyes.   °· A skin rash, itching, or irritation. °· Growths, ulcerations, blisters, or sores in the genital and anal areas. °· Abnormal vaginal discharge with or without bad odor.   °· Penile discharge in men.   °· Fever.   °· Pain or bleeding during sexual intercourse.   °· Swollen glands in the groin area.   °· Yellow skin and eyes (jaundice). This is seen with hepatitis.   °· Swollen testicles. °· Infertility. °· Sores and blisters in the mouth. °HOW ARE STDs DIAGNOSED? °To make a diagnosis, your health care provider may:  °· Take a medical history.   °· Perform a physical exam.   °· Take a sample of  any discharge to examine. °· Swab the throat, cervix, opening to the penis, rectum, or vagina for testing. °· Test a sample of your first morning urine.   °· Perform blood tests.   °· Perform a Pap test, if this applies.   °· Perform a colposcopy.   °· Perform a laparoscopy.   °HOW ARE STDs TREATED? ° Treatment depends on the STD. Some STDs may be treated but not cured.  °· Chlamydia, gonorrhea, trichomonas, and syphilis can be cured with antibiotic medicine.   °· Genital herpes, hepatitis, and HIV can be treated, but not cured, with prescribed medicines. The medicines lessen symptoms.   °· Genital warts from HPV can be treated with medicine or by freezing, burning (electrocautery), or surgery. Warts may come back.   °· HPV cannot be cured with medicine or surgery. However, abnormal areas may be removed from the cervix, vagina, or vulva.   °· If your diagnosis is confirmed, your recent sexual partners need treatment. This is true even if they are symptom-free or have a negative culture or evaluation. They should not have sex until their health care providers say it is okay. °HOW CAN I REDUCE MY RISK OF GETTING AN STD? °Take these steps to reduce your risk of getting an STD: °· Use latex condoms, dental dams, and water-soluble lubricants during sexual activity. Do not use petroleum jelly or oils. °· Avoid having multiple sex partners. °· Do not have sex with someone who has other sex partners. °· Do not have sex with anyone you do not know or who is at   high risk for an STD. °· Avoid risky sex practices that can break your skin. °· Do not have sex if you have open sores on your mouth or skin. °· Avoid drinking too much alcohol or taking illegal drugs. Alcohol and drugs can affect your judgment and put you in a vulnerable position. °· Avoid engaging in oral and anal sex acts. °· Get vaccinated for HPV and hepatitis. If you have not received these vaccines in the past, talk to your health care provider about whether one  or both might be right for you.   °· If you are at risk of being infected with HIV, it is recommended that you take a prescription medicine daily to prevent HIV infection. This is called pre-exposure prophylaxis (PrEP). You are considered at risk if: °¨ You are a man who has sex with other men (MSM). °¨ You are a heterosexual man or woman and are sexually active with more than one partner. °¨ You take drugs by injection. °¨ You are sexually active with a partner who has HIV. °· Talk with your health care provider about whether you are at high risk of being infected with HIV. If you choose to begin PrEP, you should first be tested for HIV. You should then be tested every 3 months for as long as you are taking PrEP.   °WHAT SHOULD I DO IF I THINK I HAVE AN STD? °· See your health care provider.   °· Tell your sexual partner(s). They should be tested and treated for any STDs. °· Do not have sex until your health care provider says it is okay.  °WHEN SHOULD I GET IMMEDIATE MEDICAL CARE? °Contact your health care provider right away if:  °· You have severe abdominal pain. °· You are a man and notice swelling or pain in your testicles. °· You are a woman and notice swelling or pain in your vagina. °Document Released: 11/11/2002 Document Revised: 08/26/2013 Document Reviewed: 03/11/2013 °ExitCare® Patient Information ©2015 [employer]. This information is not intended to replace advice given to you by your health care provider. Make sure you discuss any questions you have with your health care provider. ° ° °Emergency Department Resource Guide °1) Find a Doctor and Pay Out of Pocket °Although you won't have to find out who is covered by your insurance plan, it is a good idea to ask around and get recommendations. You will then need to call the office and see if the doctor you have chosen will accept you as a new patient and what types of options they offer for patients who are self-pay. Some doctors offer discounts or  will set up payment plans for their patients who do not have insurance, but you will need to ask so you aren't surprised when you get to your appointment. ° °2) Contact Your Local Health Department °Not all health departments have doctors that can see patients for sick visits, but many do, so it is worth a call to see if yours does. If you don't know where your local health department is, you can check in your phone book. The CDC also has a tool to help you locate your state's health department, and many state websites also have listings of all of their local health departments. ° °3) Find a Walk-in Clinic °If your illness is not likely to be very severe or complicated, you may want to try a walk in clinic. These are popping up all over the country in pharmacies, drugstores, and shopping centers. They're usually   staffed by nurse practitioners or physician assistants that have been trained to treat common illnesses and complaints. They're usually fairly quick and inexpensive. However, if you have serious medical issues or chronic medical problems, these are probably not your best option. ° °No Primary Care Doctor: °- Call [facility] at  832-8000 - they can help you locate a primary care doctor that  accepts your insurance, provides certain services, etc. °- Physician Referral Service- 1-800-533-3463 ° °Chronic Pain Problems: °Organization         Address  Phone   Notes  °[facility]  (336) 297-2271 Patients need to be referred by their primary care doctor.  ° °Medication Assistance: °Organization         Address  Phone   Notes  °[place] [facility] [address] °[place], [place] [address] (336) 641-8030 --Must be a resident of [place] °-- Must have NO insurance coverage whatsoever (no Medicaid/ Medicare, etc.) °-- The pt. MUST have a primary care doctor that directs their care regularly and follows them in the community °  °[facility]  (866)  331-1348   °[employer]  (888) 892-1162   ° °Agencies that provide inexpensive medical care: °Organization         Address  Phone   Notes  °[facility]  (336) 832-8035   °[facility]    (336) 832-7272   °[facility] [address] °[place], [place] [address] (336) 832-4777   °[facility] [address], °[place] (336) 271-4999   °[facility]    (336) 373-0678   °[facility]    (336) 272-1050   °[facility] ° [address], [place] Phone:  (336) 832-4444, Fax:  (336) 832-4440 Hours of Operation:  9 am - 6 pm, M-F.  Also accepts Medicaid/Medicare and self-pay.  °[facility] ° [address], [place] Phone: (336) 832-3150, Fax: (336) 832-3151. Hours of Operation:  8:30 am - 5:30 pm, M-F.  Also accepts Medicaid and self-pay.  °[facility] [place] [address], [place] Phone: (336) 878-6027   °[facility] [address], [place], [place] (336)723-1848, Ext. 123 Mondays & Thursdays: 7-9 AM.  First 15 patients are seen on a first come, first serve basis. °  ° °Medicaid-accepting [place] Providers: ° °Organization         Address  Phone   Notes  °[facility] [address], [place] (336) 641-2100 Also accepts self-pay patients.  °[facility] [address], [place] ° (336) 856-9996   °[facility] [address], [place] (336) 288-8857   °[facility] [address] [place] [address], [place] (336) 299-7000   °Veita Bland [address], [place]  ° (336) 373-1557 Only accepts [place] Access Medicaid patients after they have their name applied to their card.  ° °Self-Pay (no insurance) in [place]: ° °Organization         Address  Phone   Notes  °Sickle Cell Patients, [facility] [address],  (336) 832-1970   °[facility] [address],  (336) 832-4400   °[facility] ° [address],  (336) 992-4800   °[facility]/Dr. Osei-Bonsu ° [address] [place] [address], [place] or [address], [place] (336) 841-8500 Phone number for both [place] and [place] locations is the same.  °[facility] [address], [place] (336) 299-0000   °[facility] [address] [place] [address], [place] or [address] (336) 852-7530 °(336) 878-2260   °[facility] [address], [place] (336) 350-1642, phone; (336) 294-5005, fax Sees patients 1st and 3rd Saturday of every month.  Must not qualify for public or private insurance (i.e. Medicaid, Medicare, [place] Health Choice, Veterans' Benefits) • Household income should be no more than 200% of the poverty level •The clinic cannot treat you if you are pregnant or think you are pregnant • Sexually transmitted diseases are not treated at the clinic.  ° ° °Dental Care: °Organization         Address  Phone  Notes  °[facility] [address], [place] (336) 641-6152 Accepts children up to age 21 who are enrolled in Medicaid or [place] Health Choice; pregnant women with a Medicaid card; and children who have applied for Medicaid or [place] Health Choice, but were declined, whose parents can pay a reduced fee at time of service.  °[facility] [place]  [address], [place] (336) 641-7733 Accepts children up to age 21 who are enrolled in Medicaid or [place] Health Choice; pregnant women with a Medicaid card; and children who have applied for Medicaid or [place] Health Choice, but were declined, whose parents can pay a reduced fee at time of service.  °[facility] ° [address], [place] (336)  641-4533 Patients are seen by appointment only. Walk-ins are not accepted. [facility] will see patients 18 years of age and older. °Monday - Tuesday (8am-5pm) °Most Wednesdays (8:30-5pm) °$30 per visit, cash only  °[facility] ° [address], [place] (336) 641-4533 Patients are seen by appointment only. Walk-ins are not accepted. [facility] will see patients 18 years of age and older. °One Wednesday Evening (Monthly: Volunteer Based).  $30 per visit, cash only  °[facility]  (919) 537-3737 for adults; Children under age 4, call Graduate Pediatric Dentistry at (919) [phone]. Children aged 4-14, please call (919) [phone] to request a pediatric application. ° Dental services are provided in all areas of dental care including fillings, crowns and bridges, complete and partial dentures, implants, gum treatment, root canals, and extractions. Preventive care is also provided. Treatment is provided to both adults and children. °Patients are selected via a lottery and there is often a waiting list. °  °[facility] [address], °[place] ° (336) 763-8833 [email or web address] °  °[facility] [address], [place], [place] (336)723-1848, Ext. 123 Second and Fourth Thursday of each month, opens at 6:30 AM; Clinic ends at 9 AM.  Patients are seen on a first-come first-served basis, and a limited number are seen during each clinic.  ° °[facility] ° [address], [place], [place] (336) 723-7904   Eligibility Requirements °You must have lived in [place], [place], or [place] counties for at least the last three months. °  You cannot be eligible for state or federal sponsored healthcare insurance, including Veterans Administration, Medicaid, or Medicare. °  You generally cannot be eligible for healthcare insurance through your employer.  °    How to apply: °Eligibility screenings are held every Tuesday and Wednesday afternoon  from 1:00 pm until 4:00 pm. You do not need an appointment for the interview!  °[facility] [address], [place], [place] 336-631-2330   °[facility]  336-342-8273   °[facility]  336-703-3100   °[facility]  336-570-6415   ° °Behavioral Health Resources in the Community: °Intensive Outpatient Programs °Organization         Address  Phone  Notes  °[facility] [address], [place], [place] 336-878-6098   °[facility] [address], [place], [place] 336-832-9800   °[facility] [address], [place], [place] ° 336-882-2125   °[facility] [address],  °[place], [place] 1-800-853-5163 or 336-641-4981   °Substance Abuse Resources °Organization         Address  Phone  Notes  °[facility]  336-882-2125   °[facility]  336-784-9470   °[facility]  336-285-9073   °[facility]  336-845-3988   °Residential & Outpatient Substance Abuse Program  1-800-659-3381   °Psychological Services °Organization         Address  Phone  Notes  °[facility]  336- 832-9600   °[facility]  336- 378-7881   °[facility] [address], [place] 1-800-853-5163 or 336-641-4981   ° °Mobile Crisis Teams °Organization         Address  Phone  Notes  °[facility], Mobile Crisis Care Unit  1-877-626-1772   °Assertive °[facility] ° [address] [place], [place] 336-834-9664   °Sharon DeEsch [address] °[place] 336-554-5454   ° °Self-Help/Support Groups °Organization         Address  Phone             Notes  °[facility] - variety of support groups  336- 373-1402 Call for more information  °Narcotics Anonymous (NA), [facility] [address], °[place]  2 meetings at this location  ° °Residential Treatment  Programs °Organization         Address  Phone  Notes  °[facility] [address],    °[place]  1-866-801-8205   °[facility] ° [address], [place], [place] 704-293-8524   °[facility] [address], [place] 336-845-3988 Admissions: 8am-3pm M-F  °[facility] [address]    °[place], [place] 336-841-1104   °[facility] [address], [place], [place] 336-379-7146   °[facility] [address]  °[place], [place] 336-285-9073   °[facility] Intensive Outpatient [address], [place], [place] 336-852-3033   °[facility] [address]  °[place], [place] 1-877-615-2722 or 336-784-9470   °[facility] [address] [place], [place] 336-227-7417 Accepts Medicaid  °[facility] [address]  °[place] 1-800-659-3381 Substance Abuse/Addiction Treatment  ° °[place] Behavioral Health Resources °Organization         Address  Phone  Notes  °[facility]  (888) 581-9988   °Julie Brannon, PhD [address] [place], [place]   (336) 349-5553 or (336) 951-0000   °[facility]   [address] °, [place] (336) 349-4454   °[facility] [address], [place], [place] (336) 342-8316 Insurance/Medicaid/sponsorship through [facility]  °[facility] [address]                                    [place], [place] (336) 342-8316 Therapy/tele-psych/case  °[facility] [address]  ° [place], [place] (336) 349-2233    °Dr. Arfeen  (336) 349-4544   °[facility] [place]  [employer] [place] [facility] 1) [address], [place] °2) [address], [place] °3)  [address], [place] (336) 349-3220 °(336) 342-7768 ° °(336) 342-8140   °[place] [facility] (336) 342-1394 or (336) 342-3537 (After Hours)    ° ° ° °

## 2014-03-17 NOTE — ED Provider Notes (Signed)
Medical screening examination/treatment/procedure(s) were performed by non-physician practitioner and as supervising physician I was immediately available for consultation/collaboration.   EKG Interpretation None       Juliet RudeNathan R. Rubin PayorPickering, MD 03/17/14 [id number]

## 2014-03-18 LAB — GC/CHLAMYDIA PROBE AMP
CT PROBE, AMP APTIMA: NEGATIVE
GC PROBE AMP APTIMA: POSITIVE — AB

## 2014-03-19 ENCOUNTER — Telehealth (HOSPITAL_BASED_OUTPATIENT_CLINIC_OR_DEPARTMENT_OTHER): Payer: Self-pay | Admitting: Emergency Medicine

## 2014-03-19 NOTE — Telephone Encounter (Signed)
+  Gonorrhea. Patient treated with Rocephin and Zithromax. [employer] faxed. 

## 2014-03-21 ENCOUNTER — Telehealth (HOSPITAL_COMMUNITY): Payer: Self-pay

## 2014-03-22 ENCOUNTER — Telehealth (HOSPITAL_BASED_OUTPATIENT_CLINIC_OR_DEPARTMENT_OTHER): Payer: Self-pay | Admitting: Emergency Medicine

## 2014-03-23 ENCOUNTER — Telehealth (HOSPITAL_BASED_OUTPATIENT_CLINIC_OR_DEPARTMENT_OTHER): Payer: Self-pay

## 2014-03-23 NOTE — Telephone Encounter (Signed)
Unable to reach x 3.  Letter sent to [facility] address.   

## 2014-05-07 ENCOUNTER — Telehealth (HOSPITAL_COMMUNITY): Payer: Self-pay

## 2014-05-07 NOTE — ED Notes (Signed)
Unable to reach pt by telephone or mail.

## 2014-06-04 ENCOUNTER — Emergency Department (HOSPITAL_COMMUNITY): Payer: Self-pay

## 2014-06-04 ENCOUNTER — Encounter (HOSPITAL_COMMUNITY): Payer: Self-pay | Admitting: Emergency Medicine

## 2014-06-04 ENCOUNTER — Emergency Department (HOSPITAL_COMMUNITY)
Admission: EM | Admit: 2014-06-04 | Discharge: 2014-06-04 | Disposition: A | Discharge status: Home / Self Care | Payer: Self-pay | Attending: Emergency Medicine | Admitting: Emergency Medicine

## 2014-06-04 DIAGNOSIS — Z720 Tobacco use: Secondary | ICD-10-CM | POA: Insufficient documentation

## 2014-06-04 DIAGNOSIS — Y9289 Other specified places as the place of occurrence of the external cause: Secondary | ICD-10-CM | POA: Insufficient documentation

## 2014-06-04 DIAGNOSIS — Y9383 Activity, rough housing and horseplay: Secondary | ICD-10-CM | POA: Insufficient documentation

## 2014-06-04 DIAGNOSIS — R454 Irritability and anger: Secondary | ICD-10-CM | POA: Insufficient documentation

## 2014-06-04 DIAGNOSIS — W51XXXA Accidental striking against or bumped into by another person, initial encounter: Secondary | ICD-10-CM | POA: Insufficient documentation

## 2014-06-04 DIAGNOSIS — S82892A Other fracture of left lower leg, initial encounter for closed fracture: Secondary | ICD-10-CM | POA: Insufficient documentation

## 2014-06-04 DIAGNOSIS — S82402A Unspecified fracture of shaft of left fibula, initial encounter for closed fracture: Secondary | ICD-10-CM

## 2014-06-04 MED ORDER — IBUPROFEN 800 MG PO TABS: 800.0000 mg | ORAL_TABLET | Freq: Three times a day (TID) | ORAL | Status: DC

## 2014-06-04 MED ORDER — OXYCODONE HCL 5 MG PO TABS
5.0000 mg | ORAL_TABLET | ORAL | Status: DC | PRN
Start: 2014-06-04 — End: 2014-06-11

## 2014-06-04 MED ORDER — OXYCODONE HCL 5 MG PO TABS
10.0000 mg | ORAL_TABLET | Freq: Once | ORAL | Status: AC
  Administered 2014-06-04: 10 mg via ORAL
  Filled 2014-06-04: qty 2

## 2014-06-04 MED ORDER — IBUPROFEN 400 MG PO TABS
800.0000 mg | ORAL_TABLET | Freq: Once | ORAL | Status: AC
  Administered 2014-06-04: 800 mg via ORAL
  Filled 2014-06-04: qty 2

## 2014-06-04 NOTE — ED Provider Notes (Signed)
Medical screening examination/treatment/procedure(s) were performed by non-physician practitioner and as supervising physician I was immediately available for consultation/collaboration.  Gerhard Munchobert Charlett Merkle, MD 06/04/14 [phone]

## 2014-06-04 NOTE — ED Notes (Signed)
Pt c/o left foot pain after injuring while horse playing yesterday; pt with some swelling to foot noted; CMS intact

## 2014-06-04 NOTE — Discharge Instructions (Signed)
Take Vicodin for severe pain only. No driving or operating heavy machinery while taking vicodin. This medication may cause drowsiness. Take ibuprofen as directed. Follow up with orthopedics.  Fibular Fracture, Ankle, Adult, Treated With or Without Immobilization A fibular fracture at your ankle is a break (fracture) bone in the smallest of the two bones in your lower leg, located on the outside of your leg (fibula) close to the area at your ankle joint. CAUSES  Rolling your ankle.  Twisting your ankle.  Extreme flexing or extending of your foot.  Severe force on your ankle as when falling from a distance. RISK FACTORS  Jumping activities.  Participation in sports.  Osteoporosis.  Advanced age.  Previous ankle injuries. SIGNS AND SYMPTOMS  Pain.  Swelling.  Inability to put weight on injured ankle.  Bruising.  Bone deformities at site of injury. DIAGNOSIS  This fracture is diagnosed with the help of an X-ray exam. TREATMENT  If the fractured bone did not move out of place it usually will heal without problems and does casting or splinting. If immobilization is needed for comfort or the fractured bone moved out of place and will not heal properly with immobilization, a cast or splint will be used. HOME CARE INSTRUCTIONS   Apply ice to the area of injury:  Put ice in a plastic bag.  Place a towel between your skin and the bag.  Leave the ice on for 20 minutes, 2-3 times a day.  Use crutches as directed. Resume walking without crutches as directed by your health care provider.  Only take over-the-counter or prescription medicines for pain, discomfort, or fever as directed by your health care provider.  If you have a removable splint or boot, do not remove the boot unless directed by your health care provider. SEEK MEDICAL CARE IF:   You have continued pain or more swelling  The medications do not control the pain. SEEK IMMEDIATE MEDICAL CARE IF:  You develop  severe pain in the leg or foot.  Your skin or nails below the injury turn blue or grey or feel cold or numb. MAKE SURE YOU:   Understand these instructions.  Will watch your condition.  Will get help right away if you are not doing well or get worse. Document Released: 08/21/2005 Document Revised: 06/11/2013 Document Reviewed: 04/02/2013 [facility] Patient Information 2015 [place] Beach[place]Care, [place]. This information is not intended to replace advice given to you by your health care provider. Make sure you discuss any questions you have with your health care provider.

## 2014-06-04 NOTE — ED Notes (Signed)
Ortho called to place splint. 
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
## 2014-06-04 NOTE — Progress Notes (Signed)
Orthopedic Tech Progress Note Patient Details:  [name] Jensen Sep 05, 1980 [id number]  Ortho Devices Type of Ortho Device: Stirrup splint;Post (short leg) splint;Crutches Ortho Device/Splint Location: LLE Ortho Device/Splint Interventions: Application   [name] 06/04/2014, 12:25 PM

## 2014-06-04 NOTE — ED Notes (Signed)
Ortho paged. 

## 2014-06-08 ENCOUNTER — Encounter (HOSPITAL_COMMUNITY): Payer: Self-pay | Admitting: Emergency Medicine

## 2014-06-08 ENCOUNTER — Emergency Department (HOSPITAL_COMMUNITY)
Admission: EM | Admit: 2014-06-08 | Discharge: 2014-06-08 | Disposition: A | Discharge status: Home / Self Care | Payer: Self-pay | Attending: Emergency Medicine | Admitting: Emergency Medicine

## 2014-06-08 DIAGNOSIS — Z791 Long term (current) use of non-steroidal anti-inflammatories (NSAID): Secondary | ICD-10-CM | POA: Insufficient documentation

## 2014-06-08 DIAGNOSIS — Z87828 Personal history of other (healed) physical injury and trauma: Secondary | ICD-10-CM | POA: Insufficient documentation

## 2014-06-08 DIAGNOSIS — Z4789 Encounter for other orthopedic aftercare: Secondary | ICD-10-CM | POA: Insufficient documentation

## 2014-06-08 DIAGNOSIS — M7989 Other specified soft tissue disorders: Secondary | ICD-10-CM | POA: Insufficient documentation

## 2014-06-08 DIAGNOSIS — Z720 Tobacco use: Secondary | ICD-10-CM | POA: Insufficient documentation

## 2014-06-08 NOTE — ED Notes (Signed)
Ortho at the bedside.
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
## 2014-06-08 NOTE — ED Notes (Signed)
Ortho paged for new splint

## 2014-06-08 NOTE — ED Notes (Signed)
Pt fractured ankle on right leg and was seen Thursday, today has swelling of right foot, PMS intact. Swelling is noted, pt denies keeping foot elevated

## 2014-06-08 NOTE — ED Provider Notes (Signed)
Medical screening examination/treatment/procedure(s) were performed by non-physician practitioner and as supervising physician I was immediately available for consultation/collaboration.   EKG Interpretation None       Olivia Mackielga M Larrie Fraizer, MD 06/08/14 [phone]

## 2014-06-08 NOTE — Discharge Instructions (Signed)
1. Medications: usual home medications 2. Treatment: rest, drink plenty of fluids, elevate, ice, DO NOT WALK ON CAST 3. Follow Up: Please followup with your primary doctor for discussion of your diagnoses and further evaluation after today's visit; if you do not have a primary care doctor use the resource guide provided to find one;     Emergency Department Resource Guide 1) Find a Doctor and Pay Out of Pocket Although you won't have to find out who is covered by your insurance plan, it is a good idea to ask around and get recommendations. You will then need to call the office and see if the doctor you have chosen will accept you as a new patient and what types of options they offer for patients who are self-pay. Some doctors offer discounts or will set up payment plans for their patients who do not have insurance, but you will need to ask so you aren't surprised when you get to your appointment.  2) Contact Your Local Health Department Not all health departments have doctors that can see patients for sick visits, but many do, so it is worth a call to see if yours does. If you don't know where your local health department is, you can check in your phone book. The CDC also has a tool to help you locate your state's health department, and many state websites also have listings of all of their local health departments.  3) Find a Walk-in Clinic If your illness is not likely to be very severe or complicated, you may want to try a walk in clinic. These are popping up all over the country in pharmacies, drugstores, and shopping centers. They're usually staffed by nurse practitioners or physician assistants that have been trained to treat common illnesses and complaints. They're usually fairly quick and inexpensive. However, if you have serious medical issues or chronic medical problems, these are probably not your best option.  No Primary Care Doctor: - Call Health Connect at  [phone] - they can help you  locate a primary care doctor that  accepts your insurance, provides certain services, etc. - Physician Referral Service- [phone]  Chronic Pain Problems: Organization         Address  Phone   Notes  [facility]  [phone]) [phone] Patients need to be referred by their primary care doctor.   Medication Assistance: Organization         Address  Phone   Notes  [facility] [address] [place], [place] [address] [phone]) [phone] --Must be a resident of [facility] -- Must have NO insurance coverage whatsoever (no Medicaid/ Medicare, etc.) -- The pt. MUST have a primary care doctor that directs their care regularly and follows them in the community   MedAssist  [phone]) [phone]   [employer]  [phone]    Agencies that provide inexpensive medical care: Organization         Address  Phone   Notes  [facility]  [phone]   [facility]    [phone]   [facility] [address] [place], [place] [address] [phone]) [phone]   [facility] [address], [place] [phone]   [facility]    [phone]   [facility]    [phone]   [facility]  [address], [place] Phone:  [phone]) [phone], Fax:  (  [phone] Hours of Operation:  9 am - 6 pm, M-F.  Also accepts Medicaid/Medicare and self-pay.  [facility] for [place], [address], [place] Phone: [phone], Fax: [phone]. Hours of Operation:  8:30 am - 5:30 pm, M-F.  Also accepts Medicaid and self-pay.  [facility] [address] [place] Phone: [phone]   [place], [place], [place] [phone], Ext. [phone] Mondays & Thursdays: 7-9 AM.  First 15 patients are seen on a first come, first serve basis.    [place]  Providers:  Organization         Address  Phone   Notes  [facility] [address], [place] [phone] Also accepts self-pay patients.  [facility] [address], [place]  [phone]   [place], [address], [place] [phone]   [facility] [address], [place] [phone]   Lucianne Lei [address], [place]   [phone] Only accepts [place] Access [place] patients after they have their name applied to their card.   Self-Pay (no insurance) in [facility]:  Organization         Address  Phone   Notes  Sickle Cell Patients, [facility] [place] [phone]   [facility] [place] [phone]   [facility] [place]  [place], [place], [place] [phone]   [facility]/Dr. Osei-Bonsu  [address], [place] or [address], [place] [phone] Phone number for both [place] and [place] locations is the same.  Urgent Medical and [facility] [address], [place] [phone]   [facility] [address] [place] or [address] [phone]   [facility] [address], [place] [phone], phone; [phone], fax Sees patients 1st and 3rd Saturday of every month.  Must not qualify for public or private insurance (i.e. Medicaid, Medicare, [place] Health Choice, Veterans' Benefits)  Household income should be no more than 200% of the poverty level The clinic cannot treat you if you are pregnant or think you are pregnant  Sexually transmitted diseases are not treated at the clinic.    Dental Care: Organization         Address  Phone  Notes  [facility] Department of [facility] [place] [phone] Accepts children up to age 38 who are enrolled in [place] or [place]; pregnant women with a Medicaid card; and children who have applied for Medicaid or [place] Health Choice, but were declined, whose parents can pay a reduced fee at time of service.  [facility] Department of [facility]  [address], [place] [phone] Accepts children up to age 17 who are enrolled in [place] or [place]; pregnant women with a Medicaid card; and children who have applied for Medicaid or [place] Health Choice, but were declined, whose parents can pay a reduced fee at time of service.  [facility] Access PROGRAM  [place] [phone] Patients are seen by appointment only. Walk-ins are not accepted. [facility] will see patients 53 years of age and older. Monday - Tuesday (8am-5pm) Most Wednesdays (8:30-5pm) $30 per visit, cash only  [facility] Access PRO[facility]  [address], [facility] [phone] Patients are seen by appointment only. Walk-ins are not accepted. [place] will see patients 4 years of age and older. One Wednesday Evening (Monthly: Volunteer Based).  $30 per visit, cash only  [place]  [phone] for adults; Children under age 50, call Graduate Pediatric Dentistry at [phone]. Children aged 68-14, please call [phone] to request a pediatric application.  Dental services are provided in all areas of dental care including fillings, crowns and bridges, complete and partial dentures, implants, gum treatment, root canals, and extractions. Preventive care is also provided. Treatment is provided to both adults and children. Patients are selected via a lottery and there is often a waiting list.   [facility] [address], [place]  [phone] [email or web address]   [facility]  [address] [place], [place] [phone], Ext. [phone] Second and Fourth Thursday of each month, opens at 6:30 AM; Clinic ends at 9 AM.  Patients are seen on a first-come first-served basis, and a limited number are seen during each clinic.   [facility]  [address] Hillard Danker [place], [place] [phone]   Eligibility Requirements You must have lived in [place], [place], or [place] counties for at least the last three months.   You cannot be eligible for state or federal sponsored [employer], including [employer], [place], or [employer].   You generally cannot be eligible for healthcare insurance through your employer.    How to apply: Eligibility screenings are held every Tuesday and Wednesday afternoon from 1:00 pm until 4:00 pm. You do not need an appointment for the interview!  [facility] [address] [place], [place]   [place]  [place] Department  [place]  [phone]    Behavioral Health Resources in the Community: Intensive Outpatient Programs Organization         Address  Phone  Notes  [facility] [place]. [address] [place], [place] [phone]   [facility] [address], [place], [place]   ADS: Alcohol & Drug Svcs [address] [place], [place]   [place] [address]  [place], [place] or [phone]   Substance Abuse Resources Organization         Address  Phone  Notes  Alcohol and Drug Services  [phone]   [place]  [phone]   [facility] [place]   Chinita Pester  [phone]   Residential & Outpatient Substance Abuse Program  [phone]   Psychological Services Organization         Address  Phone  Notes  [facility] [place]  [place]  [phone]   [place] [address] [place] or [phone]    Mobile Crisis Teams Organization         Address  Phone  Notes  [facility], Mobile Crisis Care Unit  [phone]   [facility] Psychotherapeutic [facility]  [address] [place], [place]   [facility] [address] [place] [phone]    Self-Help/Support Groups Organization  Address  Phone             Notes  [place] of [place] - variety of support groups  [place] Call for more information  Narcotics Anonymous (NA), [facility] [address], [employer] [place]  2 meetings at this location   Special educational needs teacher         Address  Phone  Notes  [facility]AP Residential Treatment [place],    [place]  [phone]   [facility]  [address], [place] 329924, [place], [place]   [place], [place] [phone] Admissions: 8am-3pm M-F  Incentives Substance [place] [address],    [place], [place] [phone]   [facility] [address] [place], [place], [place]   [facility] [address],  [place], [place]   [facility] Programs - Intensive Outpatient [address], [place], [place]   [facility] [address]  [place], [place] [phone] or [phone]   [facility] Services (RTS) [address] [place], [place] Accepts Medicaid  [facility] [address]  [place] [phone] Substance Abuse/Addiction Treatment   [facility] Organization         Address  Phone  Notes  [facility]  [phone]   Domenic Schwab, PhD [address] Arlis [place]a [place], [place]   [phone] or [phone]    [place], [place] [phone]   [facility] [address], [place], [place] [phone] Insurance/Medicaid/sponsorship through [facility] and Families [address], [address]                                    [place], [place] [phone] [place] [address], [place] [phone]    Dr. Adele Schilder  [phone]   [facility] Dept. 1) [address], [place] 2) [place] 3)  [place] [address], [place] [phone]  [phone]   [place] [phone] or [phone] (After Hours)

## 2014-06-11 ENCOUNTER — Encounter: Payer: Self-pay | Admitting: Family Medicine

## 2014-06-11 ENCOUNTER — Ambulatory Visit (HOSPITAL_BASED_OUTPATIENT_CLINIC_OR_DEPARTMENT_OTHER)
Admission: RE | Admit: 2014-06-11 | Discharge: 2014-06-11 | Disposition: A | Discharge status: Home / Self Care | Payer: No Typology Code available for payment source | Source: Ambulatory Visit | Attending: Diagnostic Radiology | Admitting: Diagnostic Radiology

## 2014-06-11 ENCOUNTER — Ambulatory Visit (INDEPENDENT_AMBULATORY_CARE_PROVIDER_SITE_OTHER): Payer: Self-pay | Admitting: Family Medicine

## 2014-06-11 ENCOUNTER — Ambulatory Visit: Payer: Self-pay | Admitting: Family Medicine

## 2014-06-11 VITALS — BP 135/76 | HR 61 | Ht 71.0 in | Wt 179.0 lb

## 2014-06-11 DIAGNOSIS — S82402A Unspecified fracture of shaft of left fibula, initial encounter for closed fracture: Secondary | ICD-10-CM

## 2014-06-11 DIAGNOSIS — S8262XA Displaced fracture of lateral malleolus of left fibula, initial encounter for closed fracture: Secondary | ICD-10-CM

## 2014-06-11 DIAGNOSIS — S82432D Displaced oblique fracture of shaft of left fibula, subsequent encounter for closed fracture with routine healing: Secondary | ICD-10-CM | POA: Insufficient documentation

## 2014-06-11 DIAGNOSIS — X58XXXD Exposure to other specified factors, subsequent encounter: Secondary | ICD-10-CM | POA: Insufficient documentation

## 2014-06-11 MED ORDER — OXYCODONE HCL 5 MG PO TABS: 5.0000 mg | ORAL_TABLET | Freq: Four times a day (QID) | ORAL | Status: DC | PRN

## 2014-06-11 NOTE — Patient Instructions (Addendum)
You have a distal malleolus fracture. Wear boot every time you're up and walking around. Use crutches as needed. Icing 15 minutes at a time 3-4 times a day. Ibuprofen 600mg  three times a day with food OR aleve 2 tabs twice a day with food for pain and inflammation. Oxycodone 5mg  every 6 hours as needed for severe pain. Follow up with me in 2 weeks - we will repeat your x-rays at that time.

## 2014-06-17 ENCOUNTER — Encounter: Payer: Self-pay | Admitting: Family Medicine

## 2014-06-17 DIAGNOSIS — S8262XA Displaced fracture of lateral malleolus of left fibula, initial encounter for closed fracture: Secondary | ICD-10-CM | POA: Insufficient documentation
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
## 2014-06-17 NOTE — Assessment & Plan Note (Signed)
Left lateral malleolus fracture - stress views show no widening at deltoid ligament to give concern for an unstable ankle.  Switch to boot to wear when up and walking around.  Crutches, icing, nsaids with oxycodone as needed for severe pain.  F/u in 2 weeks.  Expect 6-8 weeks for complete healing.

## 2014-06-25 ENCOUNTER — Ambulatory Visit (INDEPENDENT_AMBULATORY_CARE_PROVIDER_SITE_OTHER): Payer: Self-pay | Admitting: Family Medicine

## 2014-06-25 ENCOUNTER — Encounter: Payer: Self-pay | Admitting: Family Medicine

## 2014-06-25 ENCOUNTER — Ambulatory Visit (HOSPITAL_BASED_OUTPATIENT_CLINIC_OR_DEPARTMENT_OTHER)
Admission: RE | Admit: 2014-06-25 | Discharge: 2014-06-25 | Disposition: A | Discharge status: Home / Self Care | Payer: Self-pay | Source: Ambulatory Visit | Attending: Family Medicine | Admitting: Family Medicine

## 2014-06-25 VITALS — BP 145/99 | HR 65 | Ht 71.0 in | Wt 179.0 lb

## 2014-06-25 DIAGNOSIS — S8262XD Displaced fracture of lateral malleolus of left fibula, subsequent encounter for closed fracture with routine healing: Secondary | ICD-10-CM

## 2014-06-25 DIAGNOSIS — X58XXXA Exposure to other specified factors, initial encounter: Secondary | ICD-10-CM | POA: Insufficient documentation

## 2014-06-25 DIAGNOSIS — S82402D Unspecified fracture of shaft of left fibula, subsequent encounter for closed fracture with routine healing: Secondary | ICD-10-CM | POA: Insufficient documentation

## 2014-06-25 MED ORDER — OXYCODONE HCL 5 MG PO TABS: 5.0000 mg | ORAL_TABLET | Freq: Four times a day (QID) | ORAL | Status: DC | PRN

## 2014-06-25 NOTE — Patient Instructions (Addendum)
You have a distal malleolus fracture. Wear boot every time you're up and walking around. Use crutches as needed if pain is too severe. Icing 15 minutes at a time 3-4 times a day. Ibuprofen 600mg  three times a day with food OR aleve 2 tabs twice a day with food for pain and inflammation. Oxycodone 5mg  every 6 hours as needed for severe pain. Follow up with me in 4 weeks - we will repeat your x-rays at that time.

## 2014-06-30 ENCOUNTER — Encounter: Payer: Self-pay | Admitting: Family Medicine

## 2014-06-30 NOTE — Assessment & Plan Note (Signed)
repeat radiographs without additional displacement.  Continue wearing boot with crutches if needed.  Icing, nsaids, oxycodone.  F/u in 4 weeks for reevaluation, repeat radiographs.
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
## 2014-07-23 ENCOUNTER — Ambulatory Visit: Payer: Self-pay | Admitting: Family Medicine

## 2014-07-24 ENCOUNTER — Encounter: Payer: Self-pay | Admitting: Family Medicine

## 2014-07-24 ENCOUNTER — Ambulatory Visit (HOSPITAL_BASED_OUTPATIENT_CLINIC_OR_DEPARTMENT_OTHER)
Admission: RE | Admit: 2014-07-24 | Discharge: 2014-07-24 | Disposition: A | Discharge status: Home / Self Care | Payer: Self-pay | Source: Ambulatory Visit | Attending: Family Medicine | Admitting: Family Medicine

## 2014-07-24 ENCOUNTER — Ambulatory Visit (INDEPENDENT_AMBULATORY_CARE_PROVIDER_SITE_OTHER): Payer: Self-pay | Admitting: Family Medicine

## 2014-07-24 VITALS — BP 138/92 | HR 63 | Ht 71.0 in | Wt 179.0 lb

## 2014-07-24 DIAGNOSIS — S8262XD Displaced fracture of lateral malleolus of left fibula, subsequent encounter for closed fracture with routine healing: Secondary | ICD-10-CM

## 2014-07-24 DIAGNOSIS — X58XXXA Exposure to other specified factors, initial encounter: Secondary | ICD-10-CM | POA: Insufficient documentation

## 2014-07-24 NOTE — Patient Instructions (Signed)
Ok to transition out of the cam walker at this point to a regular shoe. Return to work on 11/24. Wait until you're 3 months out before doing a lot of running and any cutting sports (December 29th). Call if you have any problems otherwise follow-up as needed.
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
## 2014-07-27 NOTE — Assessment & Plan Note (Signed)
repeat radiographs without additional displacement and now show callus.  He is 6 weeks out from the injury.  Will switch to regular shoe at this point.  Wait to return to work until 11/24 full duty.  Call for any issues - should be sore and stiff for 1-2 weeks before this resolves.  Can consider physical therapy if he struggles.  Otherwise f/u prn.

## 2014-09-22 ENCOUNTER — Encounter (HOSPITAL_COMMUNITY): Payer: Self-pay | Admitting: Physical Medicine and Rehabilitation

## 2014-09-22 ENCOUNTER — Emergency Department (HOSPITAL_COMMUNITY)
Admission: EM | Admit: 2014-09-22 | Discharge: 2014-09-22 | Disposition: A | Discharge status: Home / Self Care | Payer: Self-pay | Attending: Emergency Medicine | Admitting: Emergency Medicine

## 2014-09-22 DIAGNOSIS — Z720 Tobacco use: Secondary | ICD-10-CM | POA: Insufficient documentation

## 2014-09-22 DIAGNOSIS — Y998 Other external cause status: Secondary | ICD-10-CM | POA: Insufficient documentation

## 2014-09-22 DIAGNOSIS — X58XXXA Exposure to other specified factors, initial encounter: Secondary | ICD-10-CM | POA: Insufficient documentation

## 2014-09-22 DIAGNOSIS — S39012A Strain of muscle, fascia and tendon of lower back, initial encounter: Secondary | ICD-10-CM | POA: Insufficient documentation

## 2014-09-22 DIAGNOSIS — Z791 Long term (current) use of non-steroidal anti-inflammatories (NSAID): Secondary | ICD-10-CM | POA: Insufficient documentation

## 2014-09-22 DIAGNOSIS — Z79899 Other long term (current) drug therapy: Secondary | ICD-10-CM | POA: Insufficient documentation

## 2014-09-22 DIAGNOSIS — Y9289 Other specified places as the place of occurrence of the external cause: Secondary | ICD-10-CM | POA: Insufficient documentation

## 2014-09-22 DIAGNOSIS — M62830 Muscle spasm of back: Secondary | ICD-10-CM | POA: Insufficient documentation

## 2014-09-22 DIAGNOSIS — Y9389 Activity, other specified: Secondary | ICD-10-CM | POA: Insufficient documentation

## 2014-09-22 DIAGNOSIS — S29012A Strain of muscle and tendon of back wall of thorax, initial encounter: Secondary | ICD-10-CM

## 2014-09-22 LAB — URINALYSIS, ROUTINE W REFLEX MICROSCOPIC
Bilirubin Urine: NEGATIVE
Glucose, UA: NEGATIVE mg/dL
Hgb urine dipstick: NEGATIVE
Ketones, ur: 15 mg/dL — AB
Leukocytes, UA: NEGATIVE
Nitrite: NEGATIVE
Protein, ur: NEGATIVE mg/dL
Specific Gravity, Urine: 1.022 (ref 1.005–1.030)
Urobilinogen, UA: 0.2 mg/dL (ref 0.0–1.0)
pH: 5.5 (ref 5.0–8.0)

## 2014-09-22 MED ORDER — CYCLOBENZAPRINE HCL 10 MG PO TABS: 10.0000 mg | ORAL_TABLET | Freq: Two times a day (BID) | ORAL | Status: DC | PRN

## 2014-09-22 MED ORDER — IBUPROFEN 800 MG PO TABS
800.0000 mg | ORAL_TABLET | Freq: Three times a day (TID) | ORAL | Status: DC
Start: 2014-09-22 — End: 2016-04-12

## 2014-09-22 MED ORDER — IBUPROFEN 800 MG PO TABS
800.0000 mg | ORAL_TABLET | Freq: Once | ORAL | Status: AC
  Administered 2014-09-22: 800 mg via ORAL
  Filled 2014-09-22: qty 1
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
## 2014-09-22 NOTE — Discharge Instructions (Signed)
No driving or operating heavy machinery while taking flexeril. This medication may make you drowsy. Take ibuprofen as directed. Rest, apply ice intermittently for the next 24 hours followed by heat. Avoid heavy lifting or hard physical activity.  Muscle Strain A muscle strain is an injury that occurs when a muscle is stretched beyond its normal length. Usually a small number of muscle fibers are torn when this happens. Muscle strain is rated in degrees. First-degree strains have the least amount of muscle fiber tearing and pain. Second-degree and third-degree strains have increasingly more tearing and pain.  Usually, recovery from muscle strain takes 1-2 weeks. Complete healing takes 5-6 weeks.  CAUSES  Muscle strain happens when a sudden, violent force placed on a muscle stretches it too far. This may occur with lifting, sports, or a fall.  RISK FACTORS Muscle strain is especially common in athletes.  SIGNS AND SYMPTOMS At the site of the muscle strain, there may be:  Pain.  Bruising.  Swelling.  Difficulty using the muscle due to pain or lack of normal function. DIAGNOSIS  Your health care provider will perform a physical exam and ask about your medical history. TREATMENT  Often, the best treatment for a muscle strain is resting, icing, and applying cold compresses to the injured area.  HOME CARE INSTRUCTIONS   Use the PRICE method of treatment to promote muscle healing during the first 2-3 days after your injury. The PRICE method involves:  Protecting the muscle from being injured again.  Restricting your activity and resting the injured body part.  Icing your injury. To do this, put ice in a plastic bag. Place a towel between your skin and the bag. Then, apply the ice and leave it on from 15-20 minutes each hour. After the third day, switch to moist heat packs.  Apply compression to the injured area with a splint or elastic bandage. Be careful not to wrap it too tightly. This  may interfere with blood circulation or increase swelling.  Elevate the injured body part above the level of your heart as often as you can.  Only take over-the-counter or prescription medicines for pain, discomfort, or fever as directed by your health care provider.  Warming up prior to exercise helps to prevent future muscle strains. SEEK MEDICAL CARE IF:   You have increasing pain or swelling in the injured area.  You have numbness, tingling, or a significant loss of strength in the injured area. MAKE SURE YOU:   Understand these instructions.  Will watch your condition.  Will get help right away if you are not doing well or get worse. Document Released: 08/21/2005 Document Revised: 06/11/2013 Document Reviewed: 03/20/2013 [facility] Patient Information 2015 [employer] [place]. This information is not intended to replace advice given to you by your health care provider. Make sure you discuss any questions you have with your health care provider.  Spasticity Spasticity is a condition in which certain muscles contract continuously. This causes stiffness or tightness of the muscles. It may interfere with movement, speech, and manner of walking. CAUSES  This condition is usually caused by damage to the portion of the brain or spinal cord that controls voluntary movement. It may occur in association with:  Spinal cord injury.  Multiple sclerosis.  Cerebral palsy.  Brain damage due to lack of oxygen.  Brain trauma.  Severe head injury.  Metabolic diseases such as:  Adrenoleukodystrophy.  ALS Truddie Hidden(Lou Gehrig's disease).  Phenylketonuria. SYMPTOMS   Increased muscle tone (hypertonicity).  A series  of rapid muscle contractions (clonus).  Exaggerated deep tendon reflexes.  Muscle spasms.  Involuntary crossing of the legs (scissoring).  Fixed joints. The degree of spasticity varies. It ranges from mild muscle stiffness to severe, painful, and uncontrollable muscle spasms. It  can interfere with rehabilitation in patients with certain disorders. It often interferes with daily activities. TREATMENT  Treatment may include:  Medications.  Physical therapy regimens. They may include muscle stretching and range of motion exercises. These help prevent shrinkage or shortening of muscles. They also help reduce the severity of symptoms.  Surgery. This may be recommended for tendon release or to sever the nerve-muscle pathway. PROGNOSIS  The outcome for those with spasticity depends on:  Severity of the spasticity.  Associated disorder(s). Document Released: 08/11/2002 Document Revised: 11/13/2011 Document Reviewed: 11/04/2013 [facility] Patient Information 2015 [place], [place]. This information is not intended to replace advice given to you by your health care provider. Make sure you discuss any questions you have with your health care provider.

## 2014-09-22 NOTE — ED Notes (Signed)
Pt presents to department for evaluation of L sided flank pain, onset yesterday. Pain increases with movement. Denies urinary symptoms.

## 2014-10-09 NOTE — ED Provider Notes (Signed)
Medical screening examination/treatment/procedure(s) were performed by non-physician practitioner and as supervising physician I was immediately available for consultation/collaboration.  Toy BakerAnthony T Laylaa Guevarra, MD 10/09/14 [phone]

## 2015-05-08 ENCOUNTER — Encounter (HOSPITAL_COMMUNITY): Payer: Self-pay | Admitting: Emergency Medicine

## 2015-05-08 ENCOUNTER — Emergency Department (HOSPITAL_COMMUNITY)
Admission: EM | Admit: 2015-05-08 | Discharge: 2015-05-08 | Disposition: A | Discharge status: Home / Self Care | Payer: Self-pay | Attending: Emergency Medicine | Admitting: Emergency Medicine

## 2015-05-08 DIAGNOSIS — Z720 Tobacco use: Secondary | ICD-10-CM | POA: Insufficient documentation

## 2015-05-08 DIAGNOSIS — Z79899 Other long term (current) drug therapy: Secondary | ICD-10-CM | POA: Insufficient documentation

## 2015-05-08 DIAGNOSIS — Z87828 Personal history of other (healed) physical injury and trauma: Secondary | ICD-10-CM | POA: Insufficient documentation

## 2015-05-08 DIAGNOSIS — N4830 Priapism, unspecified: Secondary | ICD-10-CM | POA: Insufficient documentation

## 2015-05-08 HISTORY — DX: Male erectile dysfunction, unspecified: N52.9

## 2015-05-08 MED ORDER — HYDROMORPHONE HCL 1 MG/ML IJ SOLN
1.0000 mg | Freq: Once | INTRAMUSCULAR | Status: AC
  Administered 2015-05-08: 1 mg via INTRAMUSCULAR
  Filled 2015-05-08: qty 1

## 2015-05-08 MED ORDER — PHENYLEPHRINE 200 MCG/ML FOR PRIAPISM / HYPOTENSION
200.0000 ug | INTRAMUSCULAR | Status: DC | PRN
  Administered 2015-05-08: 200 ug via INTRACAVERNOUS
  Filled 2015-05-08: qty 50

## 2015-05-08 MED ORDER — LIDOCAINE HCL 1 % IJ SOLN
INTRAMUSCULAR | Status: AC
  Administered 2015-05-08: 20 mL
  Filled 2015-05-08: qty 20
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
## 2015-05-08 NOTE — ED Notes (Signed)
Bed: WA06 Expected date:  Expected time:  Means of arrival:  Comments: Tr 7

## 2015-05-08 NOTE — ED Notes (Addendum)
Pt came in for priapism. Pt reports he woke up with it. Pt sts this is a reoccurring problem. Pt reports painful. Pt denies discharge or drainage. Applied ice pack.

## 2015-05-08 NOTE — Discharge Instructions (Signed)
Priapism °Priapism is a persistent, often painful erection. It is the hardening of the penis in males and of the clitoris in females, even without sexual stimulation. Priapism may come on suddenly. Priapism may last a short while, or may last a long time. Priapism occurs in all ages. The types of priapism include: °· Acute prolonged priapism--Priapism that comes on suddenly and lasts. °· Recurrent acute priapism--Priapism that comes on suddenly and tends to happen again. °· Chronic priapism--Priapism that is persistent but with less of an erection. °CAUSES  °There are many causes. Causes include: °· Blood problems common in people with the following diseases: °¨ Sickle cell disease. °¨ Leukemia. °· Side effects of erectile dysfunction medicine. This is the most common cause of priapism. °· Side effects of prescription medicine used in the treatment of depression and anxiety. °· Illegal use of street drugs such as cocaine and marijuana. °· Excessive use of alcohol. °· Neurological problems such as multiple sclerosis. °· Diabetes mellitus. °· The cause may be unknown. °SIGNS AND SYMPTOMS °· A prolonged erection, usually without sexual stimulation or following the use of erectile dysfunction medicine. °· A painful erection. °DIAGNOSIS °Diagnosis of priapism can usually be confirmed by your health care provider after a physical exam. Your health care provider may have blood tests done to search for a potential cause, such as leukemia or sickle cell disease. °TREATMENT  °Treatments depend on the cause. Some specific treatments include: °· Oxygen and red blood cell transfusions in patients with sickle cell disease. °· A special treatment for plasma in those with leukemia. °· Removing blood that is trapped. °· Treatment with medicine. °· Surgical shunting (a passage that is made to allow blood to flow from one part of the body to another). °HOME CARE INFORMATION °· Avoid sexual stimulation and intercourse until your health  care provider says it is okay. °· Avoid the use of alcohol or drugs to minimize recurrence of priapism. °SEEK MEDICAL CARE IF: °· You experience worsening pain instead of improvement. °SEEK IMMEDIATE MEDICAL CARE IF: °· You experience fever or shaking chills. °· You experience pain, swelling, or redness in your genital or groin area. °MAKE SURE YOU: °· Understand these instructions.   °· Will watch your condition. °· Will get help right away if you are not doing well or get worse. °Document Released: 11/11/2003 Document Revised: 06/11/2013 Document Reviewed: 01/23/2013 °ExitCare® Patient Information ©2015 [employer]. This information is not intended to replace advice given to you by your health care provider. Make sure you discuss any questions you have with your health care provider. ° °

## 2015-05-08 NOTE — ED Notes (Signed)
Awake. Verbally responsive. A/O x4. Resp even and unlabored. No audible adventitious breath sounds noted. ABC's intact. No priapism noted. Pt reported relief from procedure.

## 2015-05-08 NOTE — ED Notes (Signed)
Pt reports intractable erection since 0700 today. Pain 10/10. Hx of same a few years ago

## 2015-05-08 NOTE — ED Notes (Signed)
MD and ED tech at bedside

## 2015-10-12 ENCOUNTER — Emergency Department (HOSPITAL_COMMUNITY)
Admission: EM | Admit: 2015-10-12 | Discharge: 2015-10-12 | Disposition: A | Discharge status: Home / Self Care | Payer: Self-pay | Attending: Emergency Medicine | Admitting: Emergency Medicine

## 2015-10-12 ENCOUNTER — Encounter (HOSPITAL_COMMUNITY): Payer: Self-pay

## 2015-10-12 ENCOUNTER — Emergency Department (HOSPITAL_COMMUNITY): Payer: Self-pay

## 2015-10-12 DIAGNOSIS — F17210 Nicotine dependence, cigarettes, uncomplicated: Secondary | ICD-10-CM | POA: Insufficient documentation

## 2015-10-12 DIAGNOSIS — J111 Influenza due to unidentified influenza virus with other respiratory manifestations: Secondary | ICD-10-CM | POA: Insufficient documentation

## 2015-10-12 DIAGNOSIS — Z87438 Personal history of other diseases of male genital organs: Secondary | ICD-10-CM | POA: Insufficient documentation

## 2015-10-12 DIAGNOSIS — Z87828 Personal history of other (healed) physical injury and trauma: Secondary | ICD-10-CM | POA: Insufficient documentation

## 2015-10-12 MED ORDER — OSELTAMIVIR PHOSPHATE 75 MG PO CAPS: 75.0000 mg | ORAL_CAPSULE | Freq: Two times a day (BID) | ORAL | Status: DC

## 2015-10-12 MED ORDER — IBUPROFEN 400 MG PO TABS
800.0000 mg | ORAL_TABLET | Freq: Once | ORAL | Status: AC
  Administered 2015-10-12: 800 mg via ORAL
  Filled 2015-10-12: qty 2

## 2015-10-12 MED ORDER — OSELTAMIVIR PHOSPHATE 75 MG PO CAPS
75.0000 mg | ORAL_CAPSULE | Freq: Once | ORAL | Status: AC
  Administered 2015-10-12: 75 mg via ORAL
  Filled 2015-10-12: qty 1

## 2015-10-12 MED ORDER — ACETAMINOPHEN 325 MG PO TABS
650.0000 mg | ORAL_TABLET | Freq: Once | ORAL | Status: AC
  Administered 2015-10-12: 650 mg via ORAL
  Filled 2015-10-12: qty 2

## 2015-10-12 NOTE — ED Notes (Signed)
Pt transported to XRay 

## 2015-10-12 NOTE — ED Notes (Signed)
Patient is alert and orientedx4.  Patient was explained discharge instructions and they understood them with no questions.   
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
## 2015-10-12 NOTE — ED Notes (Signed)
Pt here with c/o headaches, chills, hot flashes, nasal congestion, runny nose and sore throat; onset today.

## 2015-10-12 NOTE — ED Notes (Signed)
Pt c/o sore throat, fever, chills and cough

## 2015-10-12 NOTE — Discharge Instructions (Signed)
[name] Jensen,  Nice meeting you! Please follow-up with your primary care provider. You may take ibuprofen and tylenol (rotate these) for your fever. Return to the emergency department if your fevers worsen, if your headache gets worse. Feel better soon!  S. Lane Hacker, PA-C   Influenza, Adult Influenza ("the flu") is a viral infection of the respiratory tract. It occurs more often in winter months because people spend more time in close contact with one another. Influenza can make you feel very sick. Influenza easily spreads from person to person (contagious). CAUSES  Influenza is caused by a virus that infects the respiratory tract. You can catch the virus by breathing in droplets from an infected person's cough or sneeze. You can also catch the virus by touching something that was recently contaminated with the virus and then touching your mouth, nose, or eyes. RISKS AND COMPLICATIONS You may be at risk for a more severe case of influenza if you smoke cigarettes, have diabetes, have chronic heart disease (such as heart failure) or lung disease (such as asthma), or if you have a weakened immune system. Elderly people and pregnant women are also at risk for more serious infections. The most common problem of influenza is a lung infection (pneumonia). Sometimes, this problem can require emergency medical care and may be life threatening. SIGNS AND SYMPTOMS  Symptoms typically last 4 to 10 days and may include:  Fever.  Chills.  Headache, body aches, and muscle aches.  Sore throat.  Chest discomfort and cough.  Poor appetite.  Weakness or feeling tired.  Dizziness.  Nausea or vomiting. DIAGNOSIS  Diagnosis of influenza is often made based on your history and a physical exam. A nose or throat swab test can be done to confirm the diagnosis. TREATMENT  In mild cases, influenza goes away on its own. Treatment is directed at relieving symptoms. For more severe cases, your health  care provider may prescribe antiviral medicines to shorten the sickness. Antibiotic medicines are not effective because the infection is caused by a virus, not by bacteria. HOME CARE INSTRUCTIONS  Take medicines only as directed by your health care provider.  Use a cool mist humidifier to make breathing easier.  Get plenty of rest until your temperature returns to normal. This usually takes 3 to 4 days.  Drink enough fluid to keep your urine clear or pale yellow.  Cover yourmouth and nosewhen coughing or sneezing,and wash your handswellto prevent thevirusfrom spreading.  Stay homefromwork orschool untilthe fever is gonefor at least 58full day. PREVENTION  An annual influenza vaccination (flu shot) is the best way to avoid getting influenza. An annual flu shot is now routinely recommended for all adults in the [place] SEEK MEDICAL CARE IF:  You experiencechest pain, yourcough worsens,or you producemore mucus.  Youhave nausea,vomiting, ordiarrhea.  Your fever returns or gets worse. SEEK IMMEDIATE MEDICAL CARE IF:  You havetrouble breathing, you become short of breath,or your skin ornails becomebluish.  You have severe painor stiffnessin the neck.  You develop a sudden headache, or pain in the face or ear.  You have nausea or vomiting that you cannot control. MAKE SURE YOU:   Understand these instructions.  Will watch your condition.  Will get help right away if you are not doing well or get worse.   This information is not intended to replace advice given to you by your health care provider. Make sure you discuss any questions you have with your health care provider.   Document  Released: 08/18/2000 Document Revised: 09/11/2014 Document Reviewed: 11/20/2011 Elsevier Interactive Patient Education [employer]

## 2016-04-10 ENCOUNTER — Encounter (HOSPITAL_COMMUNITY): Payer: Self-pay | Admitting: *Deleted

## 2016-04-10 ENCOUNTER — Emergency Department (HOSPITAL_COMMUNITY): Payer: Self-pay

## 2016-04-10 ENCOUNTER — Emergency Department (HOSPITAL_COMMUNITY)
Admission: EM | Admit: 2016-04-10 | Discharge: 2016-04-10 | Disposition: A | Discharge status: Home / Self Care | Payer: Self-pay | Attending: Emergency Medicine | Admitting: Emergency Medicine

## 2016-04-10 DIAGNOSIS — J36 Peritonsillar abscess: Secondary | ICD-10-CM | POA: Insufficient documentation

## 2016-04-10 DIAGNOSIS — F17210 Nicotine dependence, cigarettes, uncomplicated: Secondary | ICD-10-CM | POA: Insufficient documentation

## 2016-04-10 DIAGNOSIS — R911 Solitary pulmonary nodule: Secondary | ICD-10-CM

## 2016-04-10 LAB — I-STAT CHEM 8, ED
BUN: 9 mg/dL (ref 6–20)
CALCIUM ION: 1.08 mmol/L — AB (ref 1.13–1.30)
CHLORIDE: 97 mmol/L — AB (ref 101–111)
Creatinine, Ser: 1 mg/dL (ref 0.61–1.24)
GLUCOSE: 163 mg/dL — AB (ref 65–99)
HCT: 50 % (ref 39.0–52.0)
Hemoglobin: 17 g/dL (ref 13.0–17.0)
Potassium: 3.8 mmol/L (ref 3.5–5.1)
Sodium: 138 mmol/L (ref 135–145)
TCO2: 29 mmol/L (ref 0–100)

## 2016-04-10 LAB — RAPID STREP SCREEN (MED CTR MEBANE ONLY): STREPTOCOCCUS, GROUP A SCREEN (DIRECT): NEGATIVE

## 2016-04-10 MED ORDER — DEXAMETHASONE SODIUM PHOSPHATE 10 MG/ML IJ SOLN
10.0000 mg | Freq: Once | INTRAMUSCULAR | Status: AC
  Administered 2016-04-10: 10 mg via INTRAVENOUS
  Filled 2016-04-10: qty 1

## 2016-04-10 MED ORDER — IOPAMIDOL (ISOVUE-300) INJECTION 61%
75.0000 mL | Freq: Once | INTRAVENOUS | Status: AC | PRN
  Administered 2016-04-10: 75 mL via INTRAVENOUS

## 2016-04-10 MED ORDER — KETOROLAC TROMETHAMINE 30 MG/ML IJ SOLN
30.0000 mg | Freq: Once | INTRAMUSCULAR | Status: AC
  Administered 2016-04-10: 30 mg via INTRAVENOUS
  Filled 2016-04-10: qty 1

## 2016-04-10 MED ORDER — AMOXICILLIN 400 MG/5ML PO SUSR: ORAL | 0 refills | Status: AC

## 2016-04-10 MED ORDER — HYDROCODONE-ACETAMINOPHEN 7.5-325 MG/15ML PO SOLN: 15.0000 mL | Freq: Three times a day (TID) | ORAL | 0 refills | Status: AC | PRN

## 2016-04-10 NOTE — ED Notes (Signed)
Patient transported to CT 

## 2016-04-10 NOTE — Discharge Instructions (Signed)
Today you were seen for a peritonsillar abscess. The ENT doctor will see you tomorrow morning, you need to call their office so they can give you an appointment time. They are expecting you. Take the prescribed antibiotics and pain medication as directed. Recommend soft diet to help with swallowing. As discussed, you were found to have a lung nodule on your CT today. This will need outpatient follow-up. GU may follow-up with the [facility] which is a free clinic, they can arrange an outpatient CT for you. Return here for any new or worsening symptoms.
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
## 2016-04-10 NOTE — ED Triage Notes (Signed)
Per pt report: pt presents with a sore throat since Friday.  Pt has tried cough drops and throat sprays with no relief.  Pt reports pain with swallowing and spitting.  Pt also reports left ear pain and a runny nose. Pt talking in complete sentences without difficulty. Pt ambulatory.

## 2016-04-11 ENCOUNTER — Telehealth (HOSPITAL_BASED_OUTPATIENT_CLINIC_OR_DEPARTMENT_OTHER): Payer: Self-pay | Admitting: *Deleted

## 2016-04-12 ENCOUNTER — Encounter (HOSPITAL_COMMUNITY): Payer: Self-pay | Admitting: *Deleted

## 2016-04-12 ENCOUNTER — Emergency Department (HOSPITAL_COMMUNITY)
Admission: EM | Admit: 2016-04-12 | Discharge: 2016-04-13 | Disposition: A | Discharge status: Home / Self Care | Payer: Self-pay | Attending: Emergency Medicine | Admitting: Emergency Medicine

## 2016-04-12 DIAGNOSIS — F17210 Nicotine dependence, cigarettes, uncomplicated: Secondary | ICD-10-CM | POA: Insufficient documentation

## 2016-04-12 DIAGNOSIS — J36 Peritonsillar abscess: Secondary | ICD-10-CM | POA: Insufficient documentation

## 2016-04-12 LAB — BASIC METABOLIC PANEL
ANION GAP: 8 (ref 5–15)
BUN: 8 mg/dL (ref 6–20)
CO2: 29 mmol/L (ref 22–32)
Calcium: 9.3 mg/dL (ref 8.9–10.3)
Chloride: 98 mmol/L — ABNORMAL LOW (ref 101–111)
Creatinine, Ser: 0.98 mg/dL (ref 0.61–1.24)
GFR calc Af Amer: >60 mL/min (ref >60)
Glucose, Bld: 115 mg/dL — ABNORMAL HIGH (ref 65–99)
POTASSIUM: 3.7 mmol/L (ref 3.5–5.1)
SODIUM: 135 mmol/L (ref 135–145)

## 2016-04-12 LAB — CBC WITH DIFFERENTIAL/PLATELET
BASOS ABS: 0.2 10*3/uL — AB (ref 0.0–0.1)
Basophils Relative: 1 %
Eosinophils Absolute: 0 10*3/uL (ref 0.0–0.7)
Eosinophils Relative: 0 %
HEMATOCRIT: 41.7 % (ref 39.0–52.0)
HEMOGLOBIN: 13.7 g/dL (ref 13.0–17.0)
LYMPHS PCT: 14 %
Lymphs Abs: 2.1 10*3/uL (ref 0.7–4.0)
MCH: 29.7 pg (ref 26.0–34.0)
MCHC: 32.9 g/dL (ref 30.0–36.0)
MCV: 90.5 fL (ref 78.0–100.0)
MONOS PCT: 14 %
Monocytes Absolute: 2.1 10*3/uL — ABNORMAL HIGH (ref 0.1–1.0)
Neutro Abs: 10.9 10*3/uL — ABNORMAL HIGH (ref 1.7–7.7)
Neutrophils Relative %: 71 %
Platelets: 428 10*3/uL — ABNORMAL HIGH (ref 150–400)
RBC: 4.61 MIL/uL (ref 4.22–5.81)
RDW: 12.5 % (ref 11.5–15.5)
WBC: 15.3 10*3/uL — AB (ref 4.0–10.5)

## 2016-04-12 LAB — CULTURE, GROUP A STREP (THRC)

## 2016-04-12 LAB — I-STAT CG4 LACTIC ACID, ED: Lactic Acid, Venous: 0.8 mmol/L (ref 0.5–1.9)

## 2016-04-12 MED ORDER — MORPHINE SULFATE (PF) 4 MG/ML IV SOLN
4.0000 mg | Freq: Once | INTRAVENOUS | Status: AC
  Administered 2016-04-12: 4 mg via INTRAVENOUS
  Filled 2016-04-12: qty 1

## 2016-04-12 MED ORDER — DEXAMETHASONE SODIUM PHOSPHATE 10 MG/ML IJ SOLN
10.0000 mg | Freq: Once | INTRAMUSCULAR | Status: AC
  Administered 2016-04-12: 10 mg via INTRAVENOUS
  Filled 2016-04-12: qty 1

## 2016-04-12 MED ORDER — SODIUM CHLORIDE 0.9 % IV BOLUS (SEPSIS)
1000.0000 mL | Freq: Once | INTRAVENOUS | Status: AC
  Administered 2016-04-12: 1000 mL via INTRAVENOUS

## 2016-04-12 MED ORDER — ONDANSETRON HCL 4 MG/2ML IJ SOLN
4.0000 mg | Freq: Once | INTRAMUSCULAR | Status: AC
  Administered 2016-04-12: 4 mg via INTRAVENOUS
  Filled 2016-04-12: qty 2

## 2016-04-12 NOTE — ED Triage Notes (Signed)
Pt states he was here on the 7th and diagnosed with an abscess on his tonsils. States he was sent home with antibiotics but states he cannot swallow the pills. States he cannot eat or drink.pt cannot open his mouth far enough for this RN to evaluate back of throat.

## 2016-04-12 NOTE — ED Provider Notes (Signed)
Patient evaluated yesterday for peritonsillar abscess. He has pain with swallowing. [facility] but did not follow-up due to lack of co-pay. Patient is alert and nontoxic appearing   Doug SouSam Symphonie Schneiderman, MD 04/13/16 [phone]
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
## 2016-04-12 NOTE — ED Notes (Signed)
Pt. Asking to suction his mouth. RN told pt. To wait until he sees EDP to put anything in his mouth.

## 2016-04-12 NOTE — ED Notes (Signed)
Pt given ginger ale.

## 2016-04-13 MED ORDER — OXYCODONE HCL 5 MG/5ML PO SOLN: 5.0000 mg | Freq: Four times a day (QID) | ORAL | 0 refills | Status: DC | PRN

## 2016-04-13 MED ORDER — MORPHINE SULFATE (PF) 4 MG/ML IV SOLN
4.0000 mg | Freq: Once | INTRAVENOUS | Status: DC
  Filled 2016-04-13: qty 1

## 2016-04-13 MED ORDER — OXYCODONE-IBUPROFEN 5-400 MG PO TABS: 1.0000 | ORAL_TABLET | Freq: Four times a day (QID) | ORAL | 0 refills | Status: DC | PRN

## 2016-04-13 NOTE — Discharge Instructions (Signed)
You have been seen today for reassessment of your peritonsillar abscess. You will need to go to see the ENT specialist, Dr. Lazarus SalinesWolicki, in the office first thing tomorrow morning. Return to the ED as needed.  Continue to use the pain medications previously prescribed to you.

## 2016-06-28 IMAGING — CR DG ANKLE COMPLETE 3+V*L*
3 series · 3 of 3 positions shown · non-contrast
Comparison: 06/11/2014

CLINICAL DATA: Known left fibular fracture

EXAM:
LEFT ANKLE COMPLETE - 3+ VIEW

[t ankle joint ap left]
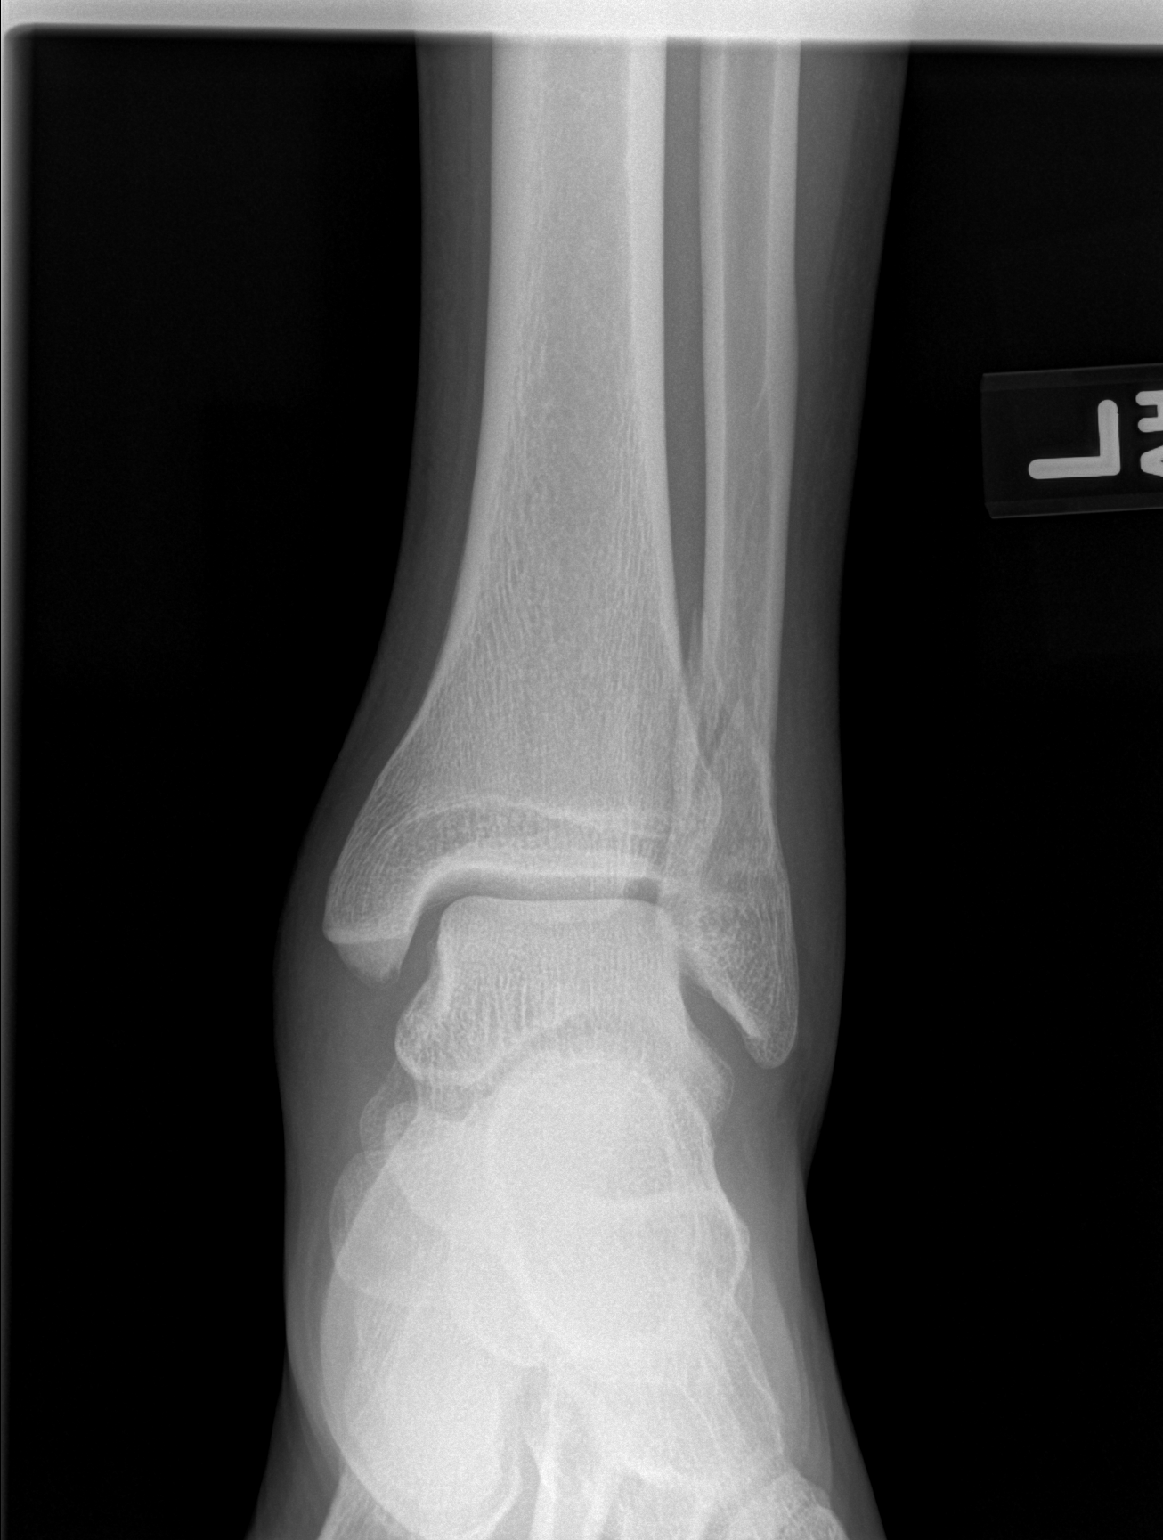

[t ankle joint oblique left]
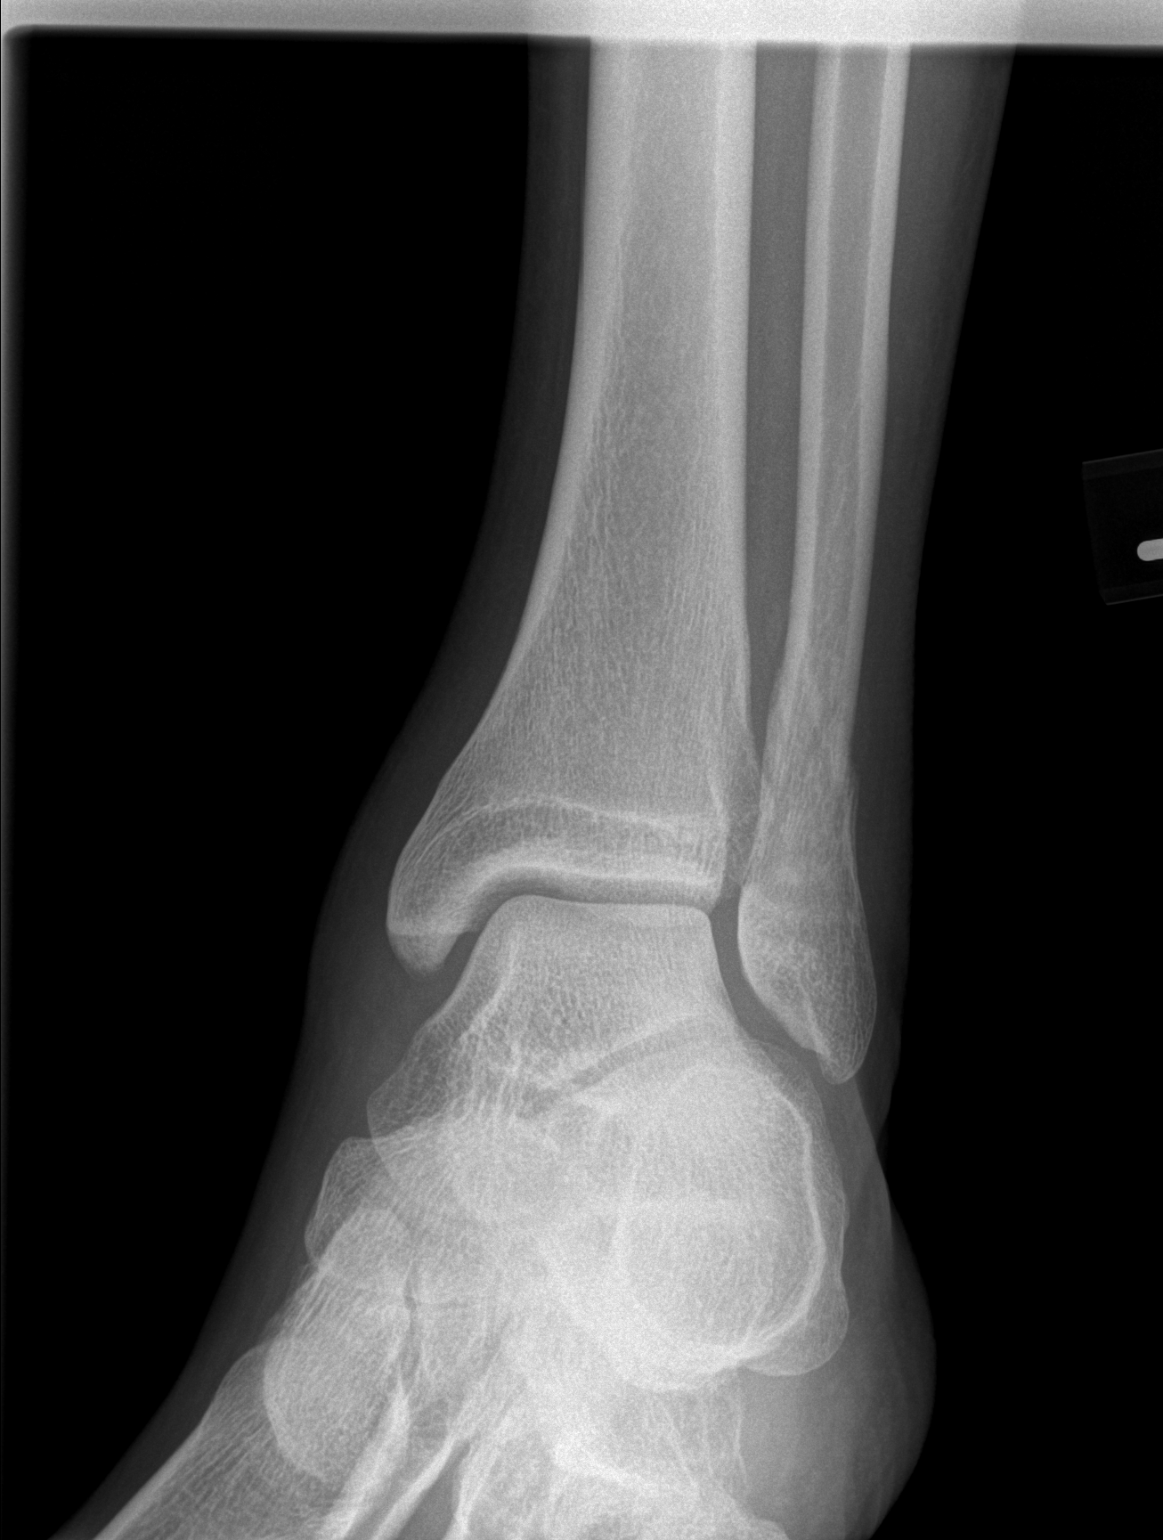

[t ankle joint lat left]
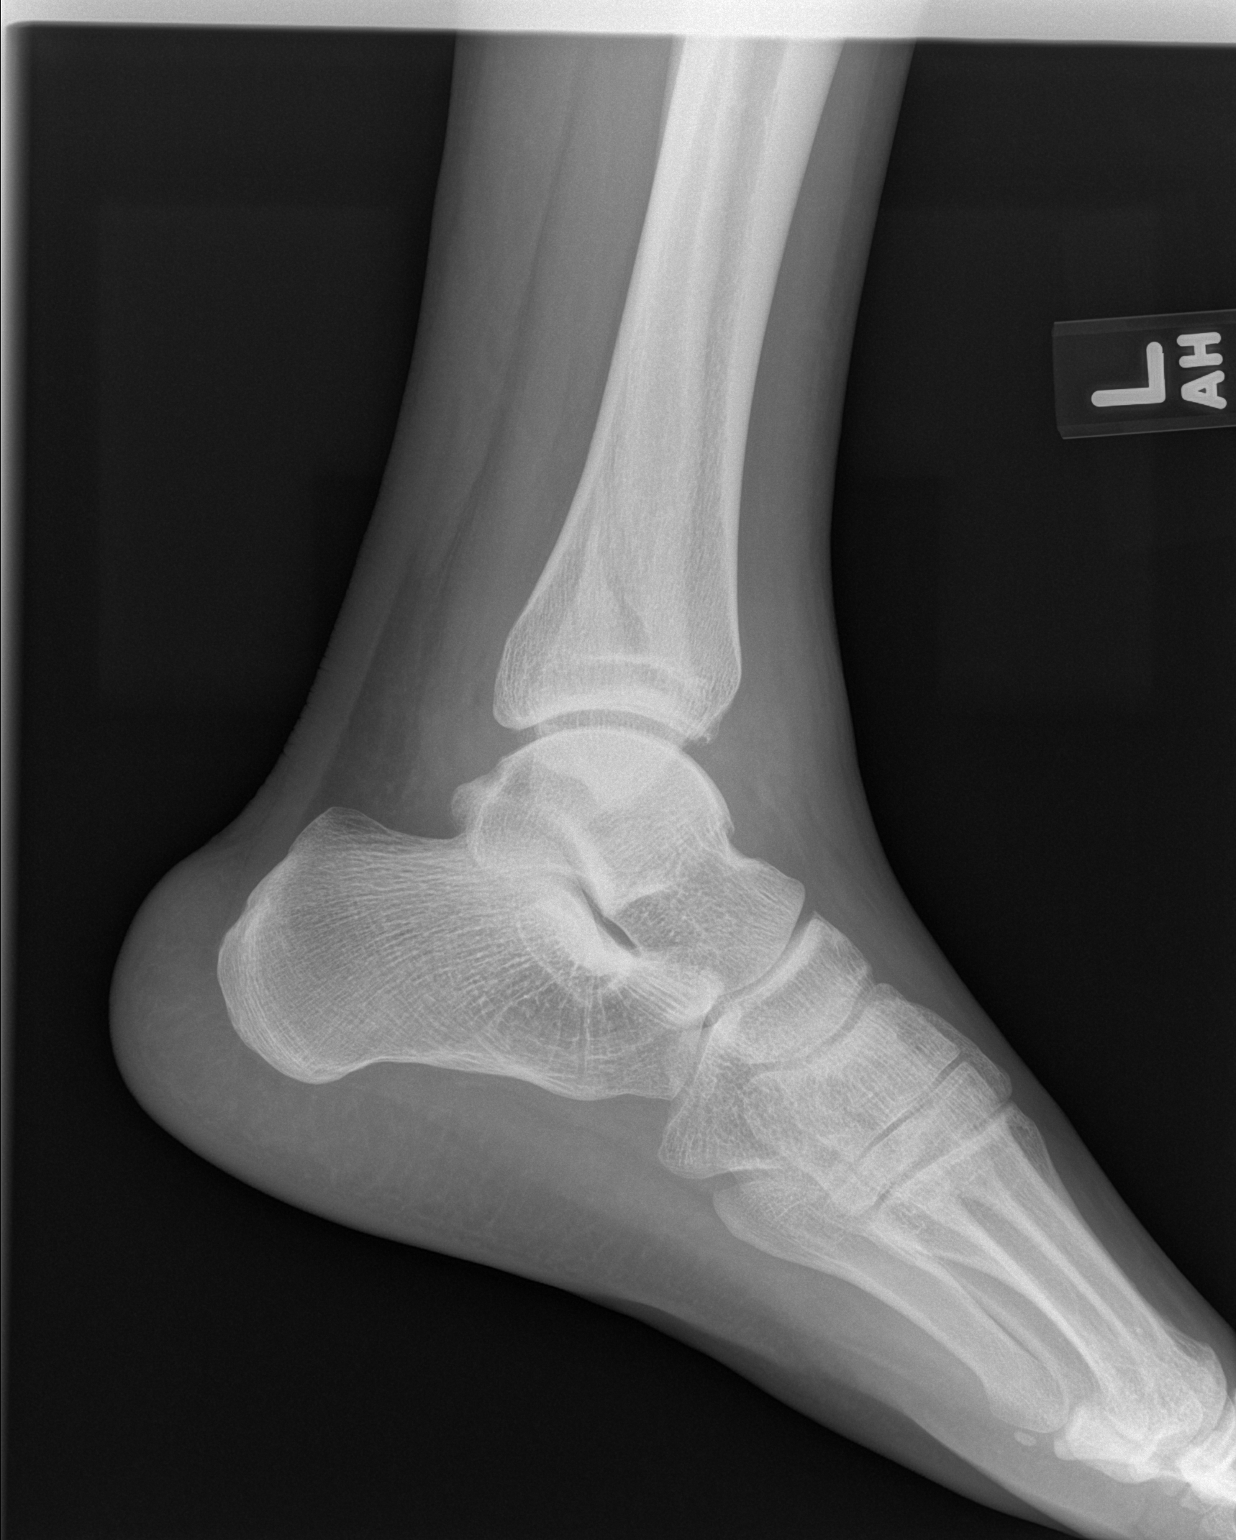

[3 of 3 positions shown; findings below may reference images not displayed]

FINDINGS: The oblique distal left fibular fracture is again identified. Degree
of displacement at the fracture site is stable. Minimal callus is
identified. No other fracture is seen.
IMPRESSION: Overall stable appearance of distal left fibular fracture.

## 2018-04-14 IMAGING — CT CT NECK W/ CM
4 of 5 series · 16 of 33 positions shown, 18 images · IV contrast (iopamidol)
Comparison: None.

CLINICAL DATA: Neck pain.  Dysphagia.  Left tonsillar swelling.

EXAM:
CT NECK WITH CONTRAST
TECHNIQUE: Multidetector CT imaging of the neck was performed using the
standard protocol following the bolus administration of intravenous
contrast.
CONTRAST:  75mL [id number]-JZZ IOPAMIDOL (FI6BQ7-JZZ) INJECTION 61%

[Series 2: neck st · axial · 0.43mm/px · z∈[-236,-90]mm · 4 of 123 slices shown, 5 images]
[im 25/123  soft-tissue]
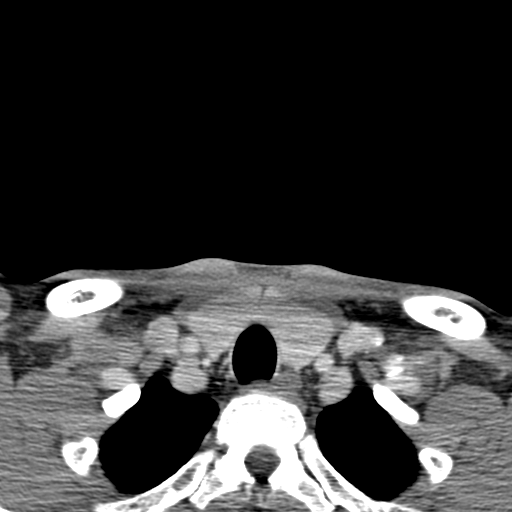
[im 25/123  bone]
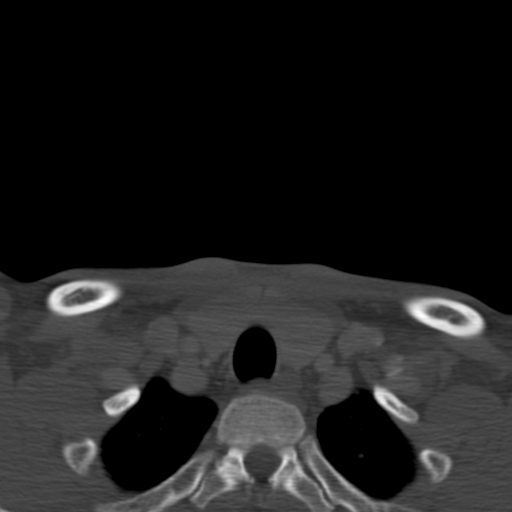
[im 49/123  bone]
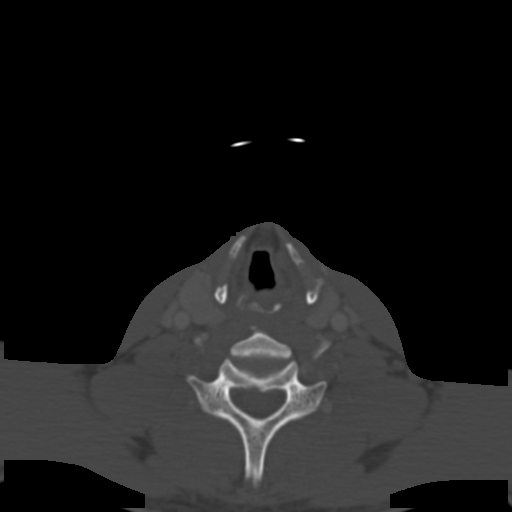
[im 74/123  bone]
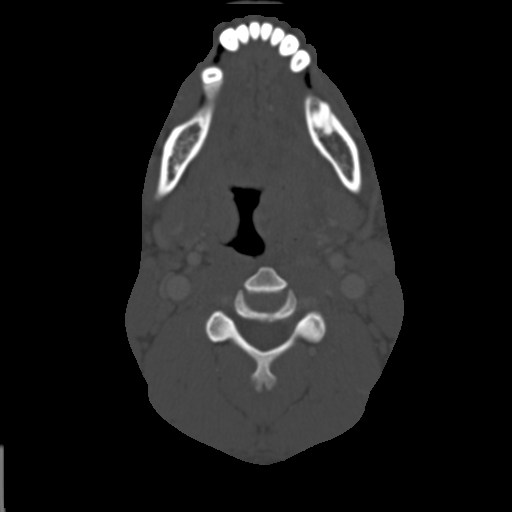
[im 98/123  bone]
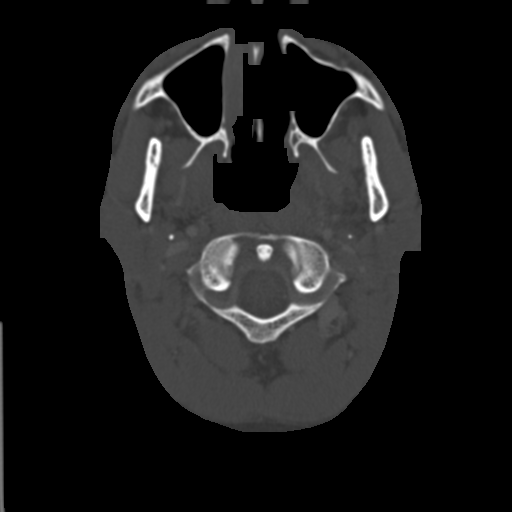

[Series 602: <mpr thick range> · coronal · 0.48mm/px · 3 of 98 slices shown]
[im 34/98  bone]
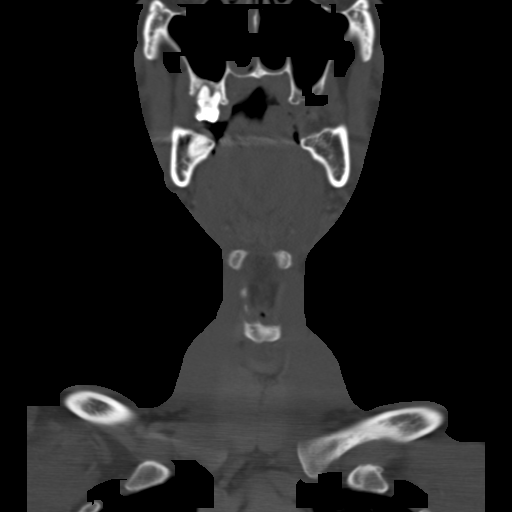
[im 44/98  bone]
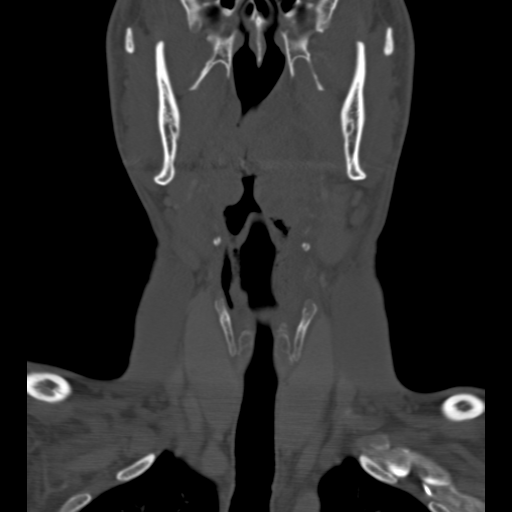
[im 54/98  bone]
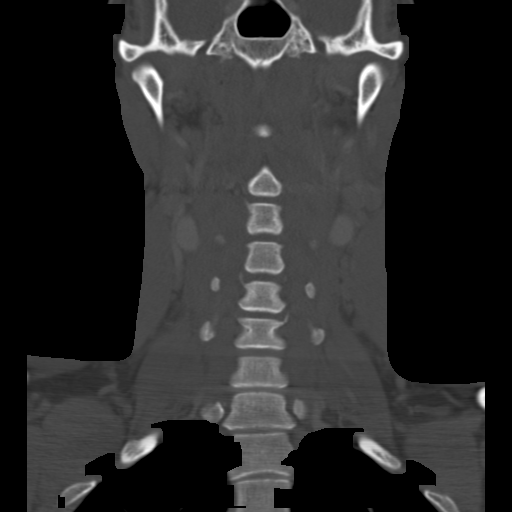

[Series 603: <mpr thick range(1)> · axial · 0.48mm/px · z∈[-303,-158]mm · 4 of 142 slices shown]
[im 29/142  bone]
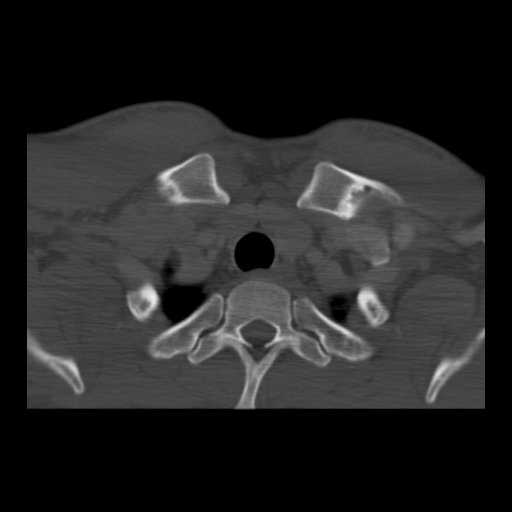
[im 57/142  bone]
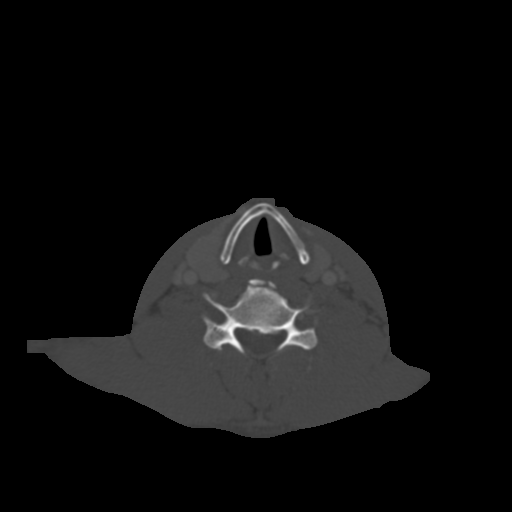
[im 85/142  bone]
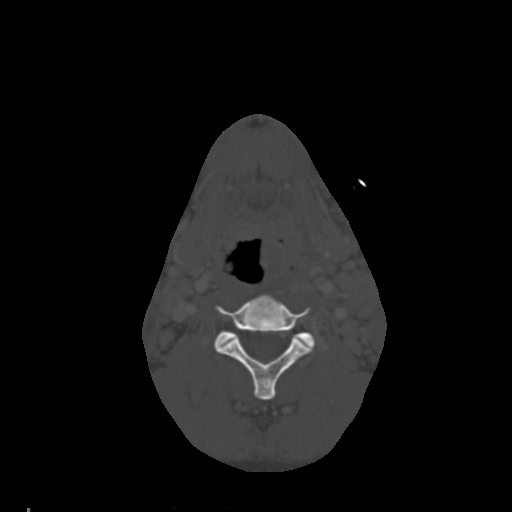
[im 113/142  bone]
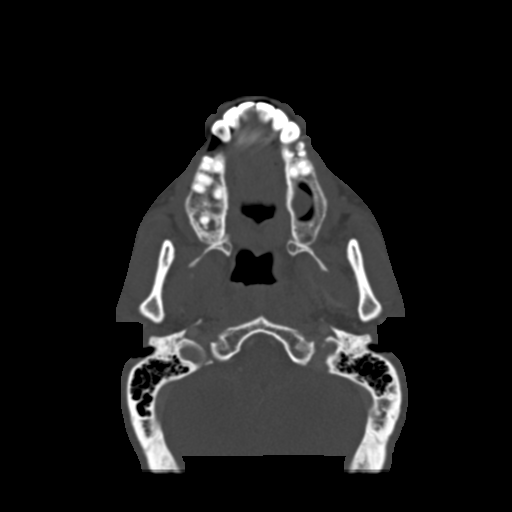

[Series 604: <mpr thick range(2)> · sagittal · 0.48mm/px · 5 of 107 slices shown, 6 images]
[im 36/107  bone]
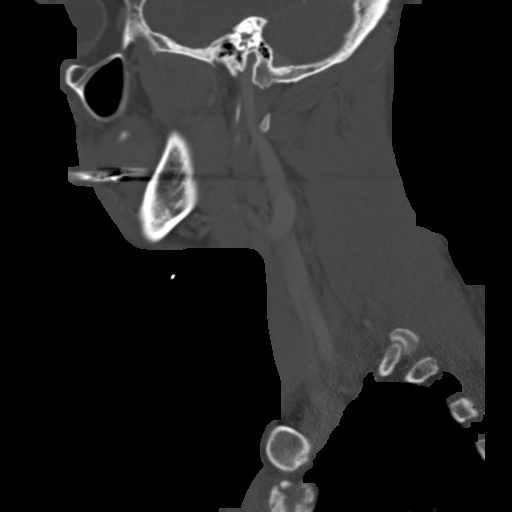
[im 45/107  bone]
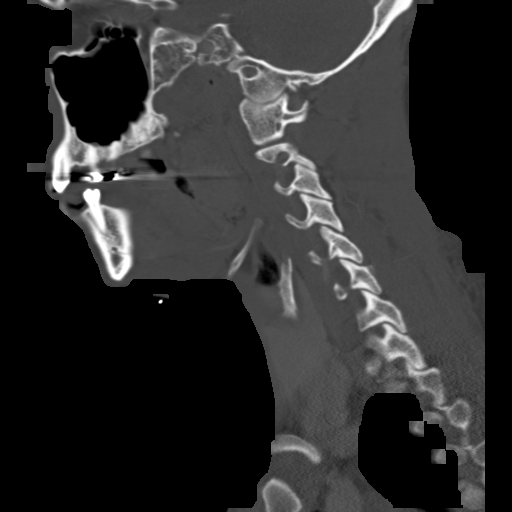
[im 54/107  soft-tissue]
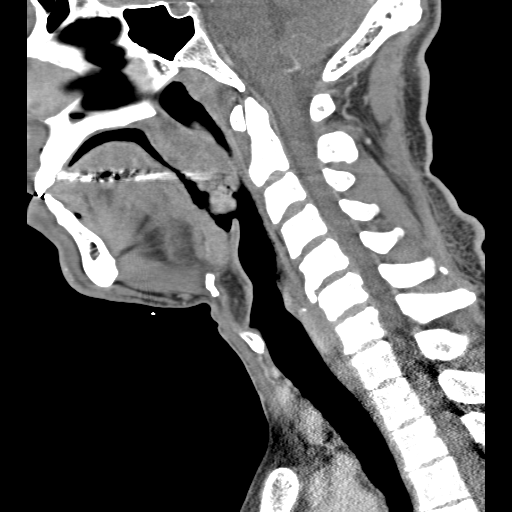
[im 54/107  bone]
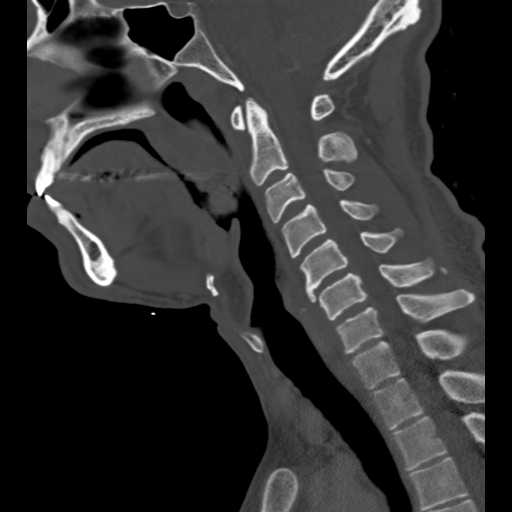
[im 62/107  bone]
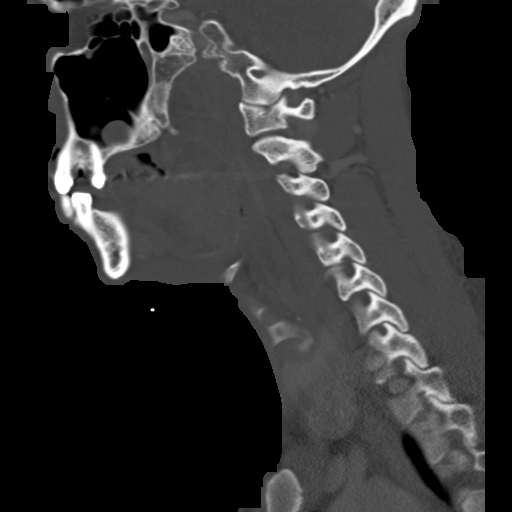
[im 71/107  bone]
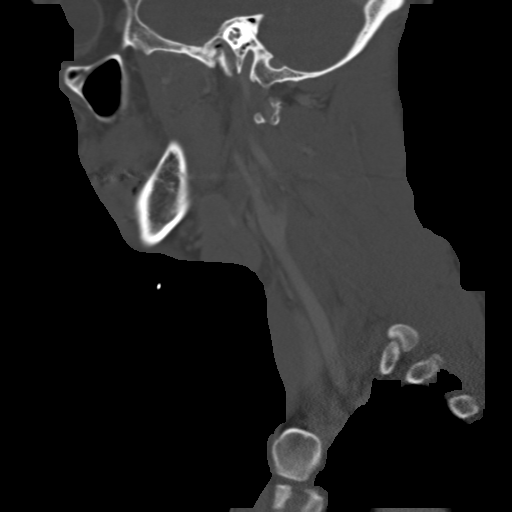

[16 of 33 positions shown; findings below may reference images not displayed]

FINDINGS: Pharynx and larynx: Asymmetric enlargement of left tonsil which is
significantly enlarged. There is a 2 cm fluid collection in the left
tonsil compatible with abscess. Oropharynx displaced to the right.
Hypo pharyngeal edema on the left extends down to the piriform
sinus.

Epiglottis normal. Vocal cords normal. Effacement of the left
ventricle due to edema.

Salivary glands: Parotid and submandibular glands normal
bilaterally.

Thyroid: Mild diffuse thyroid enlargement without focal nodule.

Lymph nodes: Left level 2 lymph node 17 mm. Multiple small posterior
lymph nodes bilaterally. Right level 2 lymph node 10 mm.

Vascular: The jugular vein and carotid artery are patent
bilaterally.

Limited intracranial: Negative

Visualized orbits: Negative

Mastoids and visualized paranasal sinuses: Mucosal edema paranasal
sinuses. Mastoid sinus clear.

Skeleton: No acute skeletal abnormality. Dental caries. Mild
cervical degenerative change and spurring at C5-6.

Upper chest: 6 mm nodule left upper lobe. No other lung nodule. No
mass in the superior mediastinum.
IMPRESSION: Enlargement of the left pharyngeal tonsil with 2 cm peritonsillar
abscess on the left. Airway displaced to the right. Hypo pharyngeal
edema on the left extends down to the piriform sinus related to
pharyngitis. Reactive adenopathy in the neck bilaterally left
greater than right

6 mm nodule left upper lobe is indeterminate. Differential includes
infection and neoplasm. The nodule is noncalcified. CT chest
recommended at this time to determine if any other nodules are
present.
